# Patient Record
Sex: Female | Born: 1950 | Race: White | Hispanic: No | Marital: Single | Smoking: Never smoker
Health system: Southern US, Community
[De-identification: ages and names within clinical notes are randomized; demographics above are authoritative.]

## PROBLEM LIST (undated history)

## (undated) DIAGNOSIS — T7840XA Allergy, unspecified, initial encounter: Secondary | ICD-10-CM

## (undated) DIAGNOSIS — F32A Depression, unspecified: Secondary | ICD-10-CM

## (undated) DIAGNOSIS — M810 Age-related osteoporosis without current pathological fracture: Secondary | ICD-10-CM

## (undated) DIAGNOSIS — M1990 Unspecified osteoarthritis, unspecified site: Secondary | ICD-10-CM

## (undated) DIAGNOSIS — C801 Malignant (primary) neoplasm, unspecified: Secondary | ICD-10-CM

## (undated) DIAGNOSIS — F329 Major depressive disorder, single episode, unspecified: Secondary | ICD-10-CM

## (undated) HISTORY — DX: Depression, unspecified: F32.A

## (undated) HISTORY — DX: Allergy, unspecified, initial encounter: T78.40XA

## (undated) HISTORY — PX: BREAST EXCISIONAL BIOPSY: SUR124

## (undated) HISTORY — PX: TUBAL LIGATION: SHX77

## (undated) HISTORY — DX: Malignant (primary) neoplasm, unspecified: C80.1

## (undated) HISTORY — DX: Major depressive disorder, single episode, unspecified: F32.9

## (undated) HISTORY — DX: Age-related osteoporosis without current pathological fracture: M81.0

## (undated) HISTORY — DX: Unspecified osteoarthritis, unspecified site: M19.90

---

## 1998-11-30 ENCOUNTER — Other Ambulatory Visit: Admission: RE | Admit: 1998-11-30 | Discharge: 1998-11-30 | Payer: Self-pay | Admitting: *Deleted

## 1999-09-07 ENCOUNTER — Other Ambulatory Visit: Admission: RE | Admit: 1999-09-07 | Discharge: 1999-09-07 | Payer: Self-pay | Admitting: *Deleted

## 1999-09-07 ENCOUNTER — Encounter (INDEPENDENT_AMBULATORY_CARE_PROVIDER_SITE_OTHER): Payer: Self-pay | Admitting: Specialist

## 1999-12-31 ENCOUNTER — Other Ambulatory Visit: Admission: RE | Admit: 1999-12-31 | Discharge: 1999-12-31 | Payer: Self-pay | Admitting: *Deleted

## 2000-01-15 ENCOUNTER — Emergency Department (HOSPITAL_COMMUNITY): Admission: EM | Admit: 2000-01-15 | Discharge: 2000-01-15 | Payer: Self-pay

## 2000-03-17 ENCOUNTER — Encounter: Payer: Self-pay | Admitting: Neurosurgery

## 2000-03-17 ENCOUNTER — Ambulatory Visit (HOSPITAL_COMMUNITY): Admission: RE | Admit: 2000-03-17 | Discharge: 2000-03-17 | Payer: Self-pay | Admitting: Neurosurgery

## 2000-12-30 ENCOUNTER — Other Ambulatory Visit: Admission: RE | Admit: 2000-12-30 | Discharge: 2000-12-30 | Payer: Self-pay | Admitting: *Deleted

## 2001-03-19 ENCOUNTER — Other Ambulatory Visit: Admission: RE | Admit: 2001-03-19 | Discharge: 2001-03-19 | Payer: Self-pay | Admitting: *Deleted

## 2002-01-13 ENCOUNTER — Other Ambulatory Visit: Admission: RE | Admit: 2002-01-13 | Discharge: 2002-01-13 | Payer: Self-pay | Admitting: *Deleted

## 2003-01-17 ENCOUNTER — Other Ambulatory Visit: Admission: RE | Admit: 2003-01-17 | Discharge: 2003-01-17 | Payer: Self-pay | Admitting: *Deleted

## 2003-10-04 ENCOUNTER — Other Ambulatory Visit: Admission: RE | Admit: 2003-10-04 | Discharge: 2003-10-04 | Payer: Self-pay | Admitting: *Deleted

## 2004-01-16 ENCOUNTER — Other Ambulatory Visit: Admission: RE | Admit: 2004-01-16 | Discharge: 2004-01-16 | Payer: Self-pay | Admitting: *Deleted

## 2005-02-28 ENCOUNTER — Other Ambulatory Visit: Admission: RE | Admit: 2005-02-28 | Discharge: 2005-02-28 | Payer: Self-pay | Admitting: *Deleted

## 2006-10-27 ENCOUNTER — Other Ambulatory Visit: Admission: RE | Admit: 2006-10-27 | Discharge: 2006-10-27 | Payer: Self-pay | Admitting: *Deleted

## 2008-09-03 ENCOUNTER — Emergency Department (HOSPITAL_BASED_OUTPATIENT_CLINIC_OR_DEPARTMENT_OTHER): Admission: EM | Admit: 2008-09-03 | Discharge: 2008-09-03 | Payer: Self-pay | Admitting: Emergency Medicine

## 2009-07-06 ENCOUNTER — Encounter: Admission: RE | Admit: 2009-07-06 | Discharge: 2009-07-06 | Payer: Self-pay | Admitting: Family Medicine

## 2009-08-28 ENCOUNTER — Other Ambulatory Visit: Admission: RE | Admit: 2009-08-28 | Discharge: 2009-08-28 | Payer: Self-pay | Admitting: Family Medicine

## 2010-01-30 ENCOUNTER — Emergency Department (HOSPITAL_BASED_OUTPATIENT_CLINIC_OR_DEPARTMENT_OTHER): Admission: EM | Admit: 2010-01-30 | Discharge: 2010-01-30 | Payer: Self-pay | Admitting: Emergency Medicine

## 2010-01-30 ENCOUNTER — Ambulatory Visit: Payer: Self-pay | Admitting: Diagnostic Radiology

## 2010-02-21 ENCOUNTER — Telehealth (INDEPENDENT_AMBULATORY_CARE_PROVIDER_SITE_OTHER): Payer: Self-pay | Admitting: *Deleted

## 2010-02-22 ENCOUNTER — Ambulatory Visit: Payer: Self-pay | Admitting: Cardiology

## 2010-02-22 ENCOUNTER — Ambulatory Visit: Payer: Self-pay

## 2010-02-22 ENCOUNTER — Encounter (HOSPITAL_COMMUNITY): Admission: RE | Admit: 2010-02-22 | Discharge: 2010-04-25 | Payer: Self-pay | Admitting: Internal Medicine

## 2010-03-02 ENCOUNTER — Telehealth: Payer: Self-pay | Admitting: Internal Medicine

## 2010-03-19 DIAGNOSIS — E785 Hyperlipidemia, unspecified: Secondary | ICD-10-CM | POA: Insufficient documentation

## 2010-12-25 NOTE — Progress Notes (Signed)
Summary: Nuclear pre procedure  Phone Note Outgoing Call Call back at [facility] Phone [phone]   Call placed by: Rea College, CMA,  February 21, 2010 4:38 PM Call placed to: Patient Summary of Call: Reviewed information on Myoview Information Sheet (see scanned document for further details).  Spoke with patient.      Nuclear Med Background Indications for Stress Test: Evaluation for Ischemia, Post Hospital  Indications Comments: 01/30/10 [facility] with chest pain, abnormal EKG; (-) enzymes.    Symptoms: Chest Tightness, Diaphoresis, DOE, Nausea, Near Syncope  Symptoms Comments: Near syncope with CP.   Nuclear Pre-Procedure Cardiac Risk Factors: Lipids

## 2010-12-25 NOTE — Progress Notes (Signed)
Summary: test results  Phone Note Call from Patient Call back at Home Phone [phone]   Caller: Patient Reason for Call: Talk to Nurse, Lab or Test Results Details for Reason: stress test.  Initial call taken by: Lorne Skeens,  March 02, 2010 12:46 PM  Follow-up for Phone Call        pt aware of results Meredith Staggers, RN  March 02, 2010 1:29 PM

## 2010-12-25 NOTE — Assessment & Plan Note (Signed)
Summary: Cardiology Nuclear Study  Nuclear Med Background Indications for Stress Test: Evaluation for Ischemia, Post Hospital  Indications Comments: 01/30/10 [facility] with chest pain, abnormal EKG; (-) enzymes.    Symptoms: Chest Tightness, Diaphoresis, Dizziness, DOE, Fatigue, Light-Headedness, Nausea, Near Syncope, Rapid HR  Symptoms Comments: Near syncope with CP.   Nuclear Pre-Procedure Cardiac Risk Factors: Lipids Caffeine/Decaff Intake: none NPO After: 7:30 PM Lungs: Clear IV 0.9% NS with Angio Cath: 20g     IV Site: (R) AC IV Started by: Stanton Kidney EMT-P Chest Size (in) 38     Cup Size C     Height (in): 63 Weight (lb): 134 BMI: 23.82  Nuclear Med Study 1 or 2 day study:  1 day     Stress Test Type:  Stress Reading MD:  Olga Millers, MD     Referring MD:  D. Bensimhon Resting Radionuclide:  Technetium 62m Tetrofosmin     Resting Radionuclide Dose:  11.0 mCi  Stress Radionuclide:  Technetium 43m Tetrofosmin     Stress Radionuclide Dose:  33.0 mCi   Stress Protocol Exercise Time (min):  8:01 min     Max HR:  151 bpm     Predicted Max HR:  163 bpm  Max Systolic BP: 161 mm Hg     Percent Max HR:  92.64 %     METS: 10.1 Rate Pressure Product:  04540    Stress Test Technologist:  Irean Hong RN     Nuclear Technologist:  Domenic Polite CNMT  Rest Procedure  Myocardial perfusion imaging was performed at rest 45 minutes following the intravenous administration of Myoview Technetium 35m Tetrofosmin.  Stress Procedure  The patient exercised for 8 minutes and 01 second.  The patient stopped due to DOE, O2 sat 95% at peak,   and denied any chest pain.  There were nonspecific ST-T wave changes, rare PAC.  Myoview was injected at peak exercise and myocardial perfusion imaging was performed after a brief delay.  QPS Raw Data Images:  Acuisition technically good; normal left ventricular size. Stress Images:  There is normal uptake in all areas. Rest Images:  Normal homogeneous  uptake in all areas of the myocardium. Subtraction (SDS):  No evidence of ischemia. Transient Ischemic Dilatation:  1.02  (Normal <1.22)  Lung/Heart Ratio:  .36  (Normal <0.45)  Quantitative Gated Spect Images QGS EDV:  143 ml QGS ESV:  74 ml QGS EF:  79 % QGS cine images:  Normal wall motion.   Overall Impression  Exercise Capacity: Fair exercise capacity. BP Response: Normal blood pressure response. Clinical Symptoms: There is dyspnea. ECG Impression: No significant ST segment change suggestive of ischemia. Overall Impression: There is no sign of scar or ischemia.  Appended Document: Cardiology Nuclear Study pt aware of results

## 2011-02-17 LAB — POCT CARDIAC MARKERS: CKMB, poc: 1.1 ng/mL (ref 1.0–8.0)

## 2011-02-17 LAB — BASIC METABOLIC PANEL
BUN: 14 mg/dL (ref 6–23)
CO2: 30 mEq/L (ref 19–32)
Calcium: 9.7 mg/dL (ref 8.4–10.5)
Chloride: 106 mEq/L (ref 96–112)
Creatinine, Ser: 0.8 mg/dL (ref 0.4–1.2)
Glucose, Bld: 93 mg/dL (ref 70–99)
Potassium: 4.2 mEq/L (ref 3.5–5.1)
Sodium: 144 mEq/L (ref 135–145)

## 2011-02-17 LAB — DIFFERENTIAL
Basophils Absolute: 0.1 10*3/uL (ref 0.0–0.1)
Eosinophils Relative: 2 % (ref 0–5)
Lymphs Abs: 2.7 10*3/uL (ref 0.7–4.0)
Neutro Abs: 4.6 10*3/uL (ref 1.7–7.7)
Neutrophils Relative %: 56 % (ref 43–77)

## 2011-02-17 LAB — CBC
Hemoglobin: 13.6 g/dL (ref 12.0–15.0)
MCV: 85.9 fL (ref 78.0–100.0)
WBC: 8.2 10*3/uL (ref 4.0–10.5)

## 2011-08-27 LAB — CBC
HCT: 40.7
Hemoglobin: 13.7
MCV: 86.5
Platelets: 250
RBC: 4.71
WBC: 12.4 — ABNORMAL HIGH

## 2011-08-27 LAB — URINALYSIS, ROUTINE W REFLEX MICROSCOPIC
Bilirubin Urine: NEGATIVE
Specific Gravity, Urine: 1.014

## 2011-08-27 LAB — PROTIME-INR
INR: 0.9
Prothrombin Time: 12.2

## 2011-08-27 LAB — DIFFERENTIAL
Basophils Relative: 1
Lymphs Abs: 1.9
Monocytes Relative: 5
Neutro Abs: 9.8 — ABNORMAL HIGH
Neutrophils Relative %: 79 — ABNORMAL HIGH

## 2011-08-27 LAB — COMPREHENSIVE METABOLIC PANEL
ALT: 16
AST: 33
Albumin: 4.3
Creatinine, Ser: 0.8
GFR calc Af Amer: >60
GFR calc non Af Amer: >60
Sodium: 143
Total Protein: 7.3

## 2011-08-27 LAB — APTT: aPTT: 27

## 2011-08-27 LAB — URINE CULTURE

## 2011-12-08 ENCOUNTER — Ambulatory Visit (INDEPENDENT_AMBULATORY_CARE_PROVIDER_SITE_OTHER): Payer: BC Managed Care – PPO

## 2011-12-08 DIAGNOSIS — B9789 Other viral agents as the cause of diseases classified elsewhere: Secondary | ICD-10-CM

## 2011-12-08 DIAGNOSIS — M62838 Other muscle spasm: Secondary | ICD-10-CM

## 2011-12-08 DIAGNOSIS — M545 Low back pain: Secondary | ICD-10-CM

## 2015-03-21 ENCOUNTER — Ambulatory Visit (INDEPENDENT_AMBULATORY_CARE_PROVIDER_SITE_OTHER): Payer: BC Managed Care – PPO | Admitting: Family Medicine

## 2015-03-21 VITALS — BP 116/74 | HR 83 | Temp 98.0°F | Resp 16 | Ht 61.5 in | Wt 133.1 lb

## 2015-03-21 DIAGNOSIS — Z23 Encounter for immunization: Secondary | ICD-10-CM

## 2015-03-21 DIAGNOSIS — W5501XA Bitten by cat, initial encounter: Secondary | ICD-10-CM

## 2015-03-21 DIAGNOSIS — S61452A Open bite of left hand, initial encounter: Secondary | ICD-10-CM

## 2015-03-21 MED ORDER — AMOXICILLIN-POT CLAVULANATE 875-125 MG PO TABS: 1.0000 | ORAL_TABLET | Freq: Two times a day (BID) | ORAL | Status: DC

## 2015-03-21 MED ORDER — MUPIROCIN 2 % EX OINT: 1.0000 "application " | TOPICAL_OINTMENT | Freq: Every day | CUTANEOUS | Status: DC

## 2015-03-21 NOTE — Patient Instructions (Addendum)
FAST Track pass for wound re-check this Friday, 03/24/15 with any provider.  Animal Bite An animal bite can result in a scratch on the skin, deep open cut, puncture of the skin, crush injury, or tearing away of the skin or a body part. Dogs are responsible for most animal bites. Children are bitten more often than adults. An animal bite can range from very mild to more serious. A small bite from your house pet is no cause for alarm. However, some animal bites can become infected or injure a bone or other tissue. You must seek medical care if:  The skin is broken and bleeding does not slow down or stop after 15 minutes.  The puncture is deep and difficult to clean (such as a cat bite).  Pain, warmth, redness, or pus develops around the wound.  The bite is from a stray animal or rodent. There may be a risk of rabies infection.  The bite is from a snake, raccoon, skunk, fox, coyote, or bat. There may be a risk of rabies infection.  The person bitten has a chronic illness such as diabetes, liver disease, or cancer, or the person takes medicine that lowers the immune system.  There is concern about the location and severity of the bite. It is important to clean and protect an animal bite wound right away to prevent infection. Follow these steps:  Clean the wound with plenty of water and soap.  Apply an antibiotic cream.  Apply gentle pressure over the wound with a clean towel or gauze to slow or stop bleeding.  Elevate the affected area above the heart to help stop any bleeding.  Seek medical care. Getting medical care within 8 hours of the animal bite leads to the best possible outcome. DIAGNOSIS  Your caregiver will most likely:  Take a detailed history of the animal and the bite injury.  Perform a wound exam.  Take your medical history. Blood tests or X-rays may be performed. Sometimes, infected bite wounds are cultured and sent to a lab to identify the infectious bacteria.   TREATMENT  Medical treatment will depend on the location and type of animal bite as well as the patient's medical history. Treatment may include:  Wound care, such as cleaning and flushing the wound with saline solution, bandaging, and elevating the affected area.  Antibiotics.  Tetanus immunization.  Rabies immunization.  Leaving the wound open to heal. This is often done with animal bites, due to the high risk of infection. However, in certain cases, wound closure with stitches, wound adhesive, skin adhesive strips, or staples may be used. Infected bites that are left untreated may require intravenous (IV) antibiotics and surgical treatment in the hospital. [place]  Follow your caregiver's instructions for wound care.  Take all medicines as directed.  If your caregiver prescribes antibiotics, take them as directed. Finish them even if you start to feel better.  Follow up with your caregiver for further exams or immunizations as directed. You may need a tetanus shot if:  You cannot remember when you had your last tetanus shot.  You have never had a tetanus shot.  The injury broke your skin. If you get a tetanus shot, your arm may swell, get red, and feel warm to the touch. This is common and not a problem. If you need a tetanus shot and you choose not to have one, there is a rare chance of getting tetanus. Sickness from tetanus can be serious. [place]  IF:  You notice warmth, redness, soreness, swelling, pus discharge, or a bad smell coming from the wound.  You have a red line on the skin coming from the wound.  You have a fever, chills, or a general ill feeling.  You have nausea or vomiting.  You have continued or worsening pain.  You have trouble moving the injured part.  You have other questions or concerns. MAKE SURE YOU:  Understand these instructions.  Will watch your condition.  Will get help right away if you are not doing well or  get worse. Document Released: 07/30/2011 Document Revised: 02/03/2012 Document Reviewed: 07/30/2011 [facility] Patient Information 2015 [place], [place]. This information is not intended to replace advice given to you by your health care provider. Make sure you discuss any questions you have with your health care provider.

## 2015-03-21 NOTE — Progress Notes (Signed)
   Subjective:    Patient ID: Melissa Bush, female    DOB: Aug 26, 1951, 64 y.o.   MRN: [id number]  HPI Melissa Bush is a 64 year-old female who presents following a cat bite to her left hand. The bite occurred around 5:30 PM this evening. The cat was not acting abnormally prior to the event. 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 No recent change in behavior. The cat was in the bath tub for a bath, aggittated with not wanting a bath, when the cat bit the patient's left hand. The cat released the patient's hand, but not until it left two deep puncture wounds to the dorsum and side of the radial aspect of the patient's proximal thumb. No thumb weakness, just pain increased with moving her thumb. Mild swelling. No numbness or tingling. Took ibuprofen initially which has helped.  Last tetanus vaccine: Overdue. Last was over 10 years ago.  Allergies  Allergen Reactions  . Sulfonamide Derivatives    Past medical history, social history, medications, and allergies were reviewed and are up to date in the chart.  Review of Systems No Purulence No Lymphangitis No Fevers No Chills No Nausea No Vomiting No Fatigue    Objective:   Physical Exam BP 116/74 mmHg  Pulse 83  Temp(Src) 98 F (36.7 C) (Oral)  Resp 16  Ht 5' 1.5" (1.562 m)  Wt 133 lb 2 oz (60.385 kg)  BMI 24.75 kg/m2  SpO2 95% General appearance: alert, cooperative, appears stated age and no distress Pulses: 2+ and symmetric Skin: 4-5 areas of punctate erythema located in a cat-mouth pattern located across the dorsum of the radial left hand. Two appear to be deep punctate lesions on the dorsum and lateral hand. Mild bleeding if expressed. No purulence. No lymphangitis. Minimal surrounding erythema. No fluctuance. Lymph nodes: No lymphadenopathy. MSK: Full ROM of the wrist, digits, MCP, PIP, and DIP joints with minimal pain. Resisted range of motion in all planes with intact strenth. Neuro: Sensation  intact throughout. Good motor strength throughout. No fasciculations or flaccidity.     Assessment & Plan:  1. Cat bite to left hand, with resulting 2 areas of deeper puncture wounds. Very low risk without suspicion for rabies, cat domestic with clear instigating event leading to the bite.  -Wound copiously irrigated and cleansed, though clearly cannot get deeper into puncture wounds on hand. -Mupirocin ointment and band-aids applied to 2 deeper wounds -Tdap vaccine given today -Rx Augmentin 875mg  bid x 10 days -Rx mupirocin topical once daily during dressing changes. Wash, clean irrigate once dialy and apply abx and band-aid. -Plan follow-up in 3 days on 03/24/15 for wound check. Fast track pass given. -Discussed reasons to RTC or go to ED including spreading rash, numbness, tingling, fever, chills, swelling, or increasing pain. She verbalized understanding and agreement.  Dr. Linnell Fulling, DO Sports Medicine Fellow  Discussed with Dr. Linna Darner.

## 2015-09-30 ENCOUNTER — Other Ambulatory Visit: Payer: Self-pay | Admitting: Family Medicine

## 2015-09-30 DIAGNOSIS — R5381 Other malaise: Secondary | ICD-10-CM

## 2015-10-06 ENCOUNTER — Other Ambulatory Visit: Payer: Self-pay | Admitting: Family Medicine

## 2015-10-06 ENCOUNTER — Other Ambulatory Visit: Payer: Self-pay

## 2015-10-06 DIAGNOSIS — Z1231 Encounter for screening mammogram for malignant neoplasm of breast: Secondary | ICD-10-CM

## 2015-10-06 DIAGNOSIS — E2839 Other primary ovarian failure: Secondary | ICD-10-CM

## 2015-11-10 ENCOUNTER — Ambulatory Visit
Admission: RE | Admit: 2015-11-10 | Discharge: 2015-11-10 | Disposition: A | Discharge status: Home / Self Care | Payer: BC Managed Care – PPO | Source: Ambulatory Visit | Attending: Family Medicine | Admitting: Family Medicine

## 2015-11-10 ENCOUNTER — Ambulatory Visit
Admission: RE | Admit: 2015-11-10 | Discharge: 2015-11-10 | Disposition: A | Discharge status: Home / Self Care | Payer: BC Managed Care – PPO | Source: Ambulatory Visit

## 2015-11-10 DIAGNOSIS — Z1231 Encounter for screening mammogram for malignant neoplasm of breast: Secondary | ICD-10-CM

## 2015-11-10 DIAGNOSIS — E2839 Other primary ovarian failure: Secondary | ICD-10-CM

## 2016-12-27 ENCOUNTER — Ambulatory Visit (INDEPENDENT_AMBULATORY_CARE_PROVIDER_SITE_OTHER): Payer: BC Managed Care – PPO | Admitting: Family Medicine

## 2016-12-27 VITALS — BP 124/68 | HR 84 | Resp 16 | Ht 63.0 in | Wt 141.2 lb

## 2016-12-27 DIAGNOSIS — Z124 Encounter for screening for malignant neoplasm of cervix: Secondary | ICD-10-CM

## 2016-12-27 DIAGNOSIS — Z1211 Encounter for screening for malignant neoplasm of colon: Secondary | ICD-10-CM

## 2016-12-27 DIAGNOSIS — Z13220 Encounter for screening for lipoid disorders: Secondary | ICD-10-CM

## 2016-12-27 DIAGNOSIS — Z1329 Encounter for screening for other suspected endocrine disorder: Secondary | ICD-10-CM

## 2016-12-27 DIAGNOSIS — M1990 Unspecified osteoarthritis, unspecified site: Secondary | ICD-10-CM | POA: Diagnosis not present

## 2016-12-27 DIAGNOSIS — Z131 Encounter for screening for diabetes mellitus: Secondary | ICD-10-CM | POA: Diagnosis not present

## 2016-12-27 DIAGNOSIS — Z0000 Encounter for general adult medical examination without abnormal findings: Secondary | ICD-10-CM

## 2016-12-27 MED ORDER — ZOSTER VACCINE LIVE 19400 UNT/0.65ML ~~LOC~~ SUSR: 0.6500 mL | Freq: Once | SUBCUTANEOUS | 0 refills | Status: DC

## 2016-12-27 MED ORDER — ZOSTER VACCINE LIVE 19400 UNT/0.65ML ~~LOC~~ SUSR: 0.6500 mL | Freq: Once | SUBCUTANEOUS | 0 refills | Status: AC

## 2016-12-27 NOTE — Progress Notes (Signed)

## 2016-12-27 NOTE — Progress Notes (Signed)
Melissa Bush  MRN: [id number] DOB: 1951-10-13  Subjective:  Melissa Bush is a 66 y.o. female who presents for annual physical exam and completion of a pre-employment form. Patient will be employed through [employer] system and working as a Economist. Reports that it has been quite sometime since she has had a complete physical exam. Last dental exam: Due now  Last vision exam:  Due now. Last exam in July 2017 in which she was dx with  borderline macular degeneration  Last pap smear: never an abnormal results. Would like final PAP today.  Last mammogram: 2016 Last colonoscopy: overdue and requests a referral  Vaccinations:      Already received influenza at a local pharmacy  Patient Active Problem List   Diagnosis Date Noted  . HYPERLIPIDEMIA-MIXED 03/19/2010    Current Outpatient Prescriptions on File Prior to Visit  Medication Sig Dispense Refill  . aspirin 81 MG tablet Take 81 mg by mouth daily.    Marland Kitchen atorvastatin (LIPITOR) 40 MG tablet Take 40 mg by mouth daily at 6 PM.     . VENLAFAXINE HCL ER PO Take 50 mg by mouth daily with breakfast.     . amoxicillin-clavulanate (AUGMENTIN) 875-125 MG per tablet Take 1 tablet by mouth 2 (two) times daily. (Patient not taking: Reported on 12/27/2016) 20 tablet 0  . mupirocin ointment (BACTROBAN) 2 % Apply 1 application topically daily. (Patient not taking: Reported on 12/27/2016) 15 g 0   No current facility-administered medications on file prior to visit.     Allergies  Allergen Reactions  . Sulfonamide Derivatives     Social History   Social History  . Marital status: Single    Spouse name: N/A  . Number of children: N/A  . Years of education: N/A   Social History Main Topics  . Smoking status: Never Smoker  . Smokeless tobacco: Never Used  . Alcohol use No  . Drug use: No  . Sexual activity: Not on file   Other Topics Concern  . Not on file   Social History Narrative  . No narrative on  file    Past Surgical History:  Procedure Laterality Date  . CESAREAN SECTION    . TUBAL LIGATION      No family history on file.  Review of Systems  Constitutional: Negative.   HENT: Positive for sinus pain and sinus pressure.   Eyes: Positive for visual disturbance.  Respiratory: Negative.   Cardiovascular: Negative.   Gastrointestinal: Positive for constipation.       Increased intake of coffee  Still constipation and Hemorrhoids    Acid reflux and she tries not to medication   Musculoskeletal: Positive for arthralgias and joint swelling.  Skin: Negative.   Neurological: Positive for headaches.       Sinus related headaches  Hx of vertigo  Psychiatric/Behavioral: Negative for agitation. The patient is nervous/anxious.        Depression related to death of husband 3 years ago Situational anxious with daughter. Managed appropriately with Effexor     Objective:  BP 124/68 (Cuff Size: Normal)   Pulse 84   Resp 16   Ht '5\' 3"'  (1.6 m)   Wt 141 lb 3.2 oz (64 kg)   SpO2 96%   BMI 25.01 kg/m   Physical Exam  Constitutional: She is oriented to person, place, and time and well-developed, well-nourished, and in no distress.  HENT:  Head: Normocephalic and atraumatic.  Right Ear: External  ear normal.  Left Ear: External ear normal.  Nose: Nose normal.  Mouth/Throat: Oropharynx is clear and moist.  Eyes: Conjunctivae and EOM are normal. Pupils are equal, round, and reactive to light.  Neck: Normal range of motion. Neck supple.  Cardiovascular: Normal rate, regular rhythm, normal heart sounds and intact distal pulses.   Pulmonary/Chest: Effort normal and breath sounds normal.  Abdominal: Soft. Bowel sounds are normal. She exhibits no distension and no mass. There is no tenderness. There is no rebound and no guarding.  Genitourinary: Vagina normal, uterus normal, cervix normal, right adnexa normal and left adnexa normal.  Musculoskeletal: Normal range of motion.    Neurological: She is alert and oriented to person, place, and time. Gait normal. GCS score is 15.  Skin: Skin is warm and dry.  Psychiatric: Mood, memory, affect and judgment normal.    Visual Acuity Screening   Right eye Left eye Both eyes  Without correction:     With correction: '20/20 20/15 20/15 '    Assessment and Plan :  Discussed healthy lifestyle, diet, exercise, preventative care, vaccinations, and addressed patient's concerns. Plan for follow up in 12 months. Otherwise, plan for specific conditions below.  1. Annual physical exam Age appropriate anticipatory guidance provided  - CBC with Differential/Platelet  2. Screening for diabetes mellitus  3. Thyroid disorder screen - Thyroid Panel With TSH - CMP14+EGFR  4. Screening, lipid - Lipid panel  5. Special screening for malignant neoplasms, colon - Ambulatory referral to Gastroenterology  6. Arthritis - Vitamin D, 25-hydroxy  7. Screening for cervical cancer - Pap IG and HPV (high risk) DNA detection  You will be notified of you lab results.  Carroll Sage. Kenton Kingfisher, MSN, FNP-C [facility] [place]

## 2016-12-27 NOTE — Patient Instructions (Addendum)
You will notified of your lab results.  I have placed a referral for your colonoscopy to Gastroenterology     IF you received an x-ray today, you will receive an invoice from [facility]. Please contact [facility] at [phone] with questions or concerns regarding your invoice.   IF you received labwork today, you will receive an invoice from [place]. Please contact [facility] at [phone] with questions or concerns regarding your invoice.   Our billing staff will not be able to assist you with questions regarding bills from these companies.  You will be contacted with the lab results as soon as they are available. The fastest way to get your results is to activate your My Chart account. Instructions are located on the last page of this paperwork. If you have not heard from [place] regarding the results in 2 weeks, please contact this office.     About Hemorrhoids  Hemorrhoids are swollen veins in the lower rectum and anus.  Also called piles, hemorrhoids are a common problem.  Hemorrhoids may be internal (inside the rectum) or external (around the anus).  Internal Hemorrhoids  Internal hemorrhoids are often painless, but they rarely cause bleeding.  The internal veins may stretch and fall down (prolapse) through the anus to the outside of the body.  The veins may then become irritated and painful.  External Hemorrhoids  External hemorrhoids can be easily seen or felt around the anal opening.  They are under the skin around the anus.  When the swollen veins are scratched or broken by straining, rubbing or wiping they sometimes bleed.  How Hemorrhoids Occur  Veins in the rectum and around the anus tend to swell under pressure.  Hemorrhoids can result from increased pressure in the veins of your anus or rectum.  Some sources of pressure are:   Straining to have a bowel movement because of constipation  Waiting too long to have a bowel movement  Coughing and  sneezing often  Sitting for extended periods of time, including on the toilet  Diarrhea  Obesity  Trauma or injury to the anus  Some liver diseases  Stress  Family history of hemorrhoids  Pregnancy  Pregnant women should try to avoid becoming constipated, because they are more likely to have hemorrhoids during pregnancy.  In the last trimester of pregnancy, the enlarged uterus may press on blood vessels and causes hemorrhoids.  In addition, the strain of childbirth sometimes causes hemorrhoids after the birth.  Symptoms of Hemorrhoids  Some symptoms of hemorrhoids include:  Swelling and/or a tender lump around the anus  Itching, mild burning and bleeding around the anus  Painful bowel movements with or without constipation  Bright red blood covering the stool, on toilet paper or in the toilet bowel.   Symptoms usually go away within a few days.  Always talk to your doctor about any bleeding to make sure it is not from some other causes.  Diagnosing and Treating Hemorrhoids  Diagnosis is made by an examination by your healthcare provider.  Special test can be performed by your doctor.    Most cases of hemorrhoids can be treated with:  High-fiber diet: Eat more high-fiber foods, which help prevent constipation.  Ask for more detailed fiber information on types and sources of fiber from your healthcare provider.  Fluids: Drink plenty of water.  This helps soften bowel movements so they are easier to pass.  Sitz baths and cold packs: Sitting in lukewarm water two or three times a  day for 15 minutes cleases the anal area and may relieve discomfort.  If the water is too hot, swelling around the anus will get worse.  Placing a cloth-covered ice pack on the anus for ten minutes four times a day can also help reduce selling.  Gently pushing a prolapsed hemorrhoid back inside after the bath or ice pack can be helpful.  Medications: For mild discomfort, your healthcare provider may  suggest over-the-counter pain medication or prescribe a cream or ointment for topical use.  The cream may contain witch hazel, zinc oxide or petroleum jelly.  Medicated suppositories are also a treatment option.  Always consult your doctor before applying medications or creams.  Procedures and surgeries: There are also a number of procedures and surgeries to shrink or remove hemorrhoids in more serious cases.  Talk to your physician about these options.  You can often prevent hemorrhoids or keep them from becoming worse by maintaining a healthy lifestyle.  Eat a fiber-rich diet of fruits, vegetables and whole grains.  Also, drink plenty of water and exercise regularly.   2007, [employer] Doc.30About Constipation  Constipation Overview Constipation is the most common gastrointestinal complaint - about 4 million Americans experience constipation and make 2.5 million physician visits a year to get help for the problem.  Constipation can occur when the colon absorbs too much water, the colon's muscle contraction is slow or sluggish, and/or there is delayed transit time through the colon.  The result is stool that is hard and dry.  Indicators of constipation include straining during bowel movements greater than 25% of the time, having fewer than three bowel movements per week, and/or the feeling of incomplete evacuation.  There are established guidelines ([place] II ) for defining constipation. A person needs to have two or more of the following symptoms for at least 12 weeks (not necessarily consecutive) in the preceding 12 months: . Straining in  greater than 25% of bowel movements . Lumpy or hard stools in greater than 25% of bowel movements . Sensation of incomplete emptying in greater than 25% of bowel movements . Sensation of anorectal obstruction/blockade in greater than 25% of bowel movements . Manual maneuvers to help empty greater than 25% of bowel movements (e.g., digital  evacuation, support of the pelvic floor)  . Less than  3 bowel movements/week . Loose stools are not present, and criteria for irritable bowel syndrome are insufficient  Common Causes of Constipation . Lack of fiber in your diet . Lack of physical activity . Medications, including iron and calcium supplements  . Dairy intake . Dehydration . Abuse of laxatives  Travel  Irritable Bowel Syndrome  Pregnancy  Luteal phase of menstruation (after ovulation and before menses)  Colorectal problems  Intestinal Dysfunction  Treating Constipation  There are several ways of treating constipation, including changes to diet and exercise, use of laxatives, adjustments to the pelvic floor, and scheduled toileting.  These treatments include: . increasing fiber and fluids in the diet  . increasing physical activity . learning muscle coordination   learning proper toileting techniques and toileting modifications   designing and sticking  to a toileting schedule     2007, [employer] Doc.22

## 2016-12-28 LAB — THYROID PANEL WITH TSH
Free Thyroxine Index: 1.5 (ref 1.2–4.9)
T3 Uptake Ratio: 23 % — ABNORMAL LOW (ref 24–39)
T4, Total: 6.7 ug/dL (ref 4.5–12.0)
TSH: 1.73 u[IU]/mL (ref 0.450–4.500)

## 2016-12-28 LAB — CMP14+EGFR
ALBUMIN: 4.6 g/dL (ref 3.6–4.8)
ALT: 31 IU/L (ref 0–32)
AST: 20 IU/L (ref 0–40)
Albumin/Globulin Ratio: 1.8 (ref 1.2–2.2)
Alkaline Phosphatase: 100 IU/L (ref 39–117)
BILIRUBIN TOTAL: 0.4 mg/dL (ref 0.0–1.2)
BUN / CREAT RATIO: 15 (ref 12–28)
BUN: 13 mg/dL (ref 8–27)
CO2: 24 mmol/L (ref 18–29)
CREATININE: 0.84 mg/dL (ref 0.57–1.00)
Calcium: 9.9 mg/dL (ref 8.7–10.3)
Chloride: 99 mmol/L (ref 96–106)
GFR calc non Af Amer: 74 mL/min/{1.73_m2} (ref >59)
GFR, EST AFRICAN AMERICAN: 85 mL/min/{1.73_m2} (ref >59)
GLOBULIN, TOTAL: 2.5 g/dL (ref 1.5–4.5)
Glucose: 90 mg/dL (ref 65–99)
Potassium: 3.9 mmol/L (ref 3.5–5.2)
SODIUM: 141 mmol/L (ref 134–144)
TOTAL PROTEIN: 7.1 g/dL (ref 6.0–8.5)

## 2016-12-28 LAB — CBC WITH DIFFERENTIAL/PLATELET
Basophils Absolute: 0 10*3/uL (ref 0.0–0.2)
Basos: 0 %
EOS (ABSOLUTE): 0.1 10*3/uL (ref 0.0–0.4)
EOS: 1 %
HEMATOCRIT: 39.8 % (ref 34.0–46.6)
HEMOGLOBIN: 13.5 g/dL (ref 11.1–15.9)
Immature Grans (Abs): 0 10*3/uL (ref 0.0–0.1)
Immature Granulocytes: 0 %
Lymphocytes Absolute: 2.6 10*3/uL (ref 0.7–3.1)
Lymphs: 33 %
MCH: 29.1 pg (ref 26.6–33.0)
MCHC: 33.9 g/dL (ref 31.5–35.7)
MCV: 86 fL (ref 79–97)
MONOCYTES: 7 %
Monocytes Absolute: 0.5 10*3/uL (ref 0.1–0.9)
NEUTROS ABS: 4.7 10*3/uL (ref 1.4–7.0)
Neutrophils: 59 %
Platelets: 322 10*3/uL (ref 150–379)
RBC: 4.64 x10E6/uL (ref 3.77–5.28)
RDW: 13.9 % (ref 12.3–15.4)
WBC: 8 10*3/uL (ref 3.4–10.8)

## 2016-12-28 LAB — LIPID PANEL
CHOL/HDL RATIO: 3.6 ratio (ref 0.0–4.4)
Cholesterol, Total: 181 mg/dL (ref 100–199)
HDL: 50 mg/dL (ref >39)
LDL CALC: 111 mg/dL — AB (ref 0–99)
Triglycerides: 99 mg/dL (ref 0–149)
VLDL CHOLESTEROL CAL: 20 mg/dL (ref 5–40)

## 2016-12-28 LAB — VITAMIN D 25 HYDROXY (VIT D DEFICIENCY, FRACTURES): Vit D, 25-Hydroxy: 41.2 ng/mL (ref 30.0–100.0)

## 2016-12-31 ENCOUNTER — Encounter: Payer: Self-pay | Admitting: Family Medicine

## 2016-12-31 LAB — PAP IG AND HPV HIGH-RISK
HPV, HIGH-RISK: NEGATIVE
PAP SMEAR COMMENT: 0

## 2017-01-27 ENCOUNTER — Encounter: Payer: Self-pay | Admitting: Family Medicine

## 2017-07-09 ENCOUNTER — Ambulatory Visit
Admission: RE | Admit: 2017-07-09 | Discharge: 2017-07-09 | Disposition: A | Discharge status: Home / Self Care | Payer: BC Managed Care – PPO | Source: Ambulatory Visit | Attending: Family Medicine | Admitting: Family Medicine

## 2017-07-09 ENCOUNTER — Other Ambulatory Visit: Payer: Self-pay | Admitting: Family Medicine

## 2017-07-09 DIAGNOSIS — H9202 Otalgia, left ear: Secondary | ICD-10-CM

## 2017-12-01 ENCOUNTER — Encounter: Payer: Self-pay | Admitting: Allergy and Immunology

## 2019-06-01 ENCOUNTER — Other Ambulatory Visit: Payer: Self-pay | Admitting: Family Medicine

## 2019-06-01 DIAGNOSIS — Z1231 Encounter for screening mammogram for malignant neoplasm of breast: Secondary | ICD-10-CM

## 2019-06-01 DIAGNOSIS — M8580 Other specified disorders of bone density and structure, unspecified site: Secondary | ICD-10-CM

## 2019-08-11 ENCOUNTER — Ambulatory Visit
Admission: RE | Admit: 2019-08-11 | Discharge: 2019-08-11 | Disposition: A | Discharge status: Home / Self Care | Payer: BC Managed Care – PPO | Source: Ambulatory Visit | Attending: Family Medicine | Admitting: Family Medicine

## 2019-08-11 ENCOUNTER — Other Ambulatory Visit: Payer: Self-pay

## 2019-08-11 ENCOUNTER — Ambulatory Visit
Admission: RE | Admit: 2019-08-11 | Discharge: 2019-08-11 | Disposition: A | Discharge status: Home / Self Care | Payer: Medicare Other | Source: Ambulatory Visit | Attending: Family Medicine | Admitting: Family Medicine

## 2019-08-11 DIAGNOSIS — M8580 Other specified disorders of bone density and structure, unspecified site: Secondary | ICD-10-CM

## 2019-08-11 DIAGNOSIS — Z1231 Encounter for screening mammogram for malignant neoplasm of breast: Secondary | ICD-10-CM

## 2020-04-11 DIAGNOSIS — E7800 Pure hypercholesterolemia, unspecified: Secondary | ICD-10-CM | POA: Diagnosis not present

## 2020-04-11 DIAGNOSIS — Z03818 Encounter for observation for suspected exposure to other biological agents ruled out: Secondary | ICD-10-CM | POA: Diagnosis not present

## 2020-04-11 DIAGNOSIS — Z20828 Contact with and (suspected) exposure to other viral communicable diseases: Secondary | ICD-10-CM | POA: Diagnosis not present

## 2020-04-19 DIAGNOSIS — Z03818 Encounter for observation for suspected exposure to other biological agents ruled out: Secondary | ICD-10-CM | POA: Diagnosis not present

## 2020-04-19 DIAGNOSIS — Z20828 Contact with and (suspected) exposure to other viral communicable diseases: Secondary | ICD-10-CM | POA: Diagnosis not present

## 2020-04-20 DIAGNOSIS — E559 Vitamin D deficiency, unspecified: Secondary | ICD-10-CM | POA: Diagnosis not present

## 2020-04-20 DIAGNOSIS — F339 Major depressive disorder, recurrent, unspecified: Secondary | ICD-10-CM | POA: Diagnosis not present

## 2020-04-20 DIAGNOSIS — Z20822 Contact with and (suspected) exposure to covid-19: Secondary | ICD-10-CM | POA: Diagnosis not present

## 2020-04-20 DIAGNOSIS — R5381 Other malaise: Secondary | ICD-10-CM | POA: Diagnosis not present

## 2020-04-20 DIAGNOSIS — R42 Dizziness and giddiness: Secondary | ICD-10-CM | POA: Diagnosis not present

## 2020-04-20 DIAGNOSIS — R110 Nausea: Secondary | ICD-10-CM | POA: Diagnosis not present

## 2020-04-20 DIAGNOSIS — E7800 Pure hypercholesterolemia, unspecified: Secondary | ICD-10-CM | POA: Diagnosis not present

## 2020-04-20 DIAGNOSIS — F419 Anxiety disorder, unspecified: Secondary | ICD-10-CM | POA: Diagnosis not present

## 2020-04-20 DIAGNOSIS — Z131 Encounter for screening for diabetes mellitus: Secondary | ICD-10-CM | POA: Diagnosis not present

## 2020-04-26 DIAGNOSIS — R110 Nausea: Secondary | ICD-10-CM | POA: Diagnosis not present

## 2020-04-26 DIAGNOSIS — E7800 Pure hypercholesterolemia, unspecified: Secondary | ICD-10-CM | POA: Diagnosis not present

## 2020-04-26 DIAGNOSIS — R42 Dizziness and giddiness: Secondary | ICD-10-CM | POA: Diagnosis not present

## 2020-04-26 DIAGNOSIS — Z131 Encounter for screening for diabetes mellitus: Secondary | ICD-10-CM | POA: Diagnosis not present

## 2020-04-26 DIAGNOSIS — R5381 Other malaise: Secondary | ICD-10-CM | POA: Diagnosis not present

## 2020-04-26 DIAGNOSIS — F339 Major depressive disorder, recurrent, unspecified: Secondary | ICD-10-CM | POA: Diagnosis not present

## 2020-04-26 DIAGNOSIS — Z20822 Contact with and (suspected) exposure to covid-19: Secondary | ICD-10-CM | POA: Diagnosis not present

## 2020-04-26 DIAGNOSIS — F419 Anxiety disorder, unspecified: Secondary | ICD-10-CM | POA: Diagnosis not present

## 2020-04-26 DIAGNOSIS — E559 Vitamin D deficiency, unspecified: Secondary | ICD-10-CM | POA: Diagnosis not present

## 2020-05-17 DIAGNOSIS — E559 Vitamin D deficiency, unspecified: Secondary | ICD-10-CM | POA: Diagnosis not present

## 2020-05-17 DIAGNOSIS — E7800 Pure hypercholesterolemia, unspecified: Secondary | ICD-10-CM | POA: Diagnosis not present

## 2020-05-17 DIAGNOSIS — R7303 Prediabetes: Secondary | ICD-10-CM | POA: Diagnosis not present

## 2020-06-09 DIAGNOSIS — I8311 Varicose veins of right lower extremity with inflammation: Secondary | ICD-10-CM | POA: Diagnosis not present

## 2020-06-09 DIAGNOSIS — I8312 Varicose veins of left lower extremity with inflammation: Secondary | ICD-10-CM | POA: Diagnosis not present

## 2020-06-12 DIAGNOSIS — L821 Other seborrheic keratosis: Secondary | ICD-10-CM | POA: Diagnosis not present

## 2020-06-12 DIAGNOSIS — L578 Other skin changes due to chronic exposure to nonionizing radiation: Secondary | ICD-10-CM | POA: Diagnosis not present

## 2020-06-12 DIAGNOSIS — D225 Melanocytic nevi of trunk: Secondary | ICD-10-CM | POA: Diagnosis not present

## 2020-06-12 DIAGNOSIS — L281 Prurigo nodularis: Secondary | ICD-10-CM | POA: Diagnosis not present

## 2020-06-12 DIAGNOSIS — D2261 Melanocytic nevi of right upper limb, including shoulder: Secondary | ICD-10-CM | POA: Diagnosis not present

## 2020-06-12 DIAGNOSIS — L814 Other melanin hyperpigmentation: Secondary | ICD-10-CM | POA: Diagnosis not present

## 2020-08-03 DIAGNOSIS — R7303 Prediabetes: Secondary | ICD-10-CM | POA: Diagnosis not present

## 2020-08-03 DIAGNOSIS — E7800 Pure hypercholesterolemia, unspecified: Secondary | ICD-10-CM | POA: Diagnosis not present

## 2020-08-03 DIAGNOSIS — E559 Vitamin D deficiency, unspecified: Secondary | ICD-10-CM | POA: Diagnosis not present

## 2020-08-17 DIAGNOSIS — Z23 Encounter for immunization: Secondary | ICD-10-CM | POA: Diagnosis not present

## 2020-08-17 DIAGNOSIS — H43813 Vitreous degeneration, bilateral: Secondary | ICD-10-CM | POA: Diagnosis not present

## 2020-08-17 DIAGNOSIS — H3554 Dystrophies primarily involving the retinal pigment epithelium: Secondary | ICD-10-CM | POA: Diagnosis not present

## 2020-08-17 DIAGNOSIS — H25012 Cortical age-related cataract, left eye: Secondary | ICD-10-CM | POA: Diagnosis not present

## 2020-08-17 DIAGNOSIS — R7303 Prediabetes: Secondary | ICD-10-CM | POA: Diagnosis not present

## 2020-08-17 DIAGNOSIS — Z961 Presence of intraocular lens: Secondary | ICD-10-CM | POA: Diagnosis not present

## 2020-08-17 DIAGNOSIS — F419 Anxiety disorder, unspecified: Secondary | ICD-10-CM | POA: Diagnosis not present

## 2020-08-17 DIAGNOSIS — F339 Major depressive disorder, recurrent, unspecified: Secondary | ICD-10-CM | POA: Diagnosis not present

## 2020-08-17 DIAGNOSIS — H2512 Age-related nuclear cataract, left eye: Secondary | ICD-10-CM | POA: Diagnosis not present

## 2020-08-17 DIAGNOSIS — E782 Mixed hyperlipidemia: Secondary | ICD-10-CM | POA: Diagnosis not present

## 2020-09-21 DIAGNOSIS — H25012 Cortical age-related cataract, left eye: Secondary | ICD-10-CM | POA: Diagnosis not present

## 2020-09-21 DIAGNOSIS — H35411 Lattice degeneration of retina, right eye: Secondary | ICD-10-CM | POA: Diagnosis not present

## 2020-09-21 DIAGNOSIS — H2512 Age-related nuclear cataract, left eye: Secondary | ICD-10-CM | POA: Diagnosis not present

## 2020-09-21 DIAGNOSIS — H3554 Dystrophies primarily involving the retinal pigment epithelium: Secondary | ICD-10-CM | POA: Diagnosis not present

## 2020-09-28 ENCOUNTER — Other Ambulatory Visit: Payer: Self-pay | Admitting: Internal Medicine

## 2020-09-28 DIAGNOSIS — Z1231 Encounter for screening mammogram for malignant neoplasm of breast: Secondary | ICD-10-CM

## 2020-09-29 ENCOUNTER — Ambulatory Visit
Admission: RE | Admit: 2020-09-29 | Discharge: 2020-09-29 | Disposition: A | Discharge status: Home / Self Care | Payer: Medicare PPO | Source: Ambulatory Visit | Attending: Internal Medicine | Admitting: Internal Medicine

## 2020-09-29 ENCOUNTER — Other Ambulatory Visit: Payer: Self-pay

## 2020-09-29 DIAGNOSIS — Z1231 Encounter for screening mammogram for malignant neoplasm of breast: Secondary | ICD-10-CM | POA: Diagnosis not present

## 2020-10-18 DIAGNOSIS — H2512 Age-related nuclear cataract, left eye: Secondary | ICD-10-CM | POA: Diagnosis not present

## 2020-10-18 DIAGNOSIS — H25012 Cortical age-related cataract, left eye: Secondary | ICD-10-CM | POA: Diagnosis not present

## 2020-10-18 DIAGNOSIS — H25812 Combined forms of age-related cataract, left eye: Secondary | ICD-10-CM | POA: Diagnosis not present

## 2020-12-13 DIAGNOSIS — S46011A Strain of muscle(s) and tendon(s) of the rotator cuff of right shoulder, initial encounter: Secondary | ICD-10-CM | POA: Diagnosis not present

## 2020-12-22 DIAGNOSIS — E559 Vitamin D deficiency, unspecified: Secondary | ICD-10-CM | POA: Diagnosis not present

## 2020-12-22 DIAGNOSIS — R7303 Prediabetes: Secondary | ICD-10-CM | POA: Diagnosis not present

## 2020-12-22 DIAGNOSIS — E782 Mixed hyperlipidemia: Secondary | ICD-10-CM | POA: Diagnosis not present

## 2020-12-27 DIAGNOSIS — N390 Urinary tract infection, site not specified: Secondary | ICD-10-CM | POA: Diagnosis not present

## 2020-12-27 DIAGNOSIS — E559 Vitamin D deficiency, unspecified: Secondary | ICD-10-CM | POA: Diagnosis not present

## 2020-12-27 DIAGNOSIS — Z1331 Encounter for screening for depression: Secondary | ICD-10-CM | POA: Diagnosis not present

## 2020-12-27 DIAGNOSIS — Z23 Encounter for immunization: Secondary | ICD-10-CM | POA: Diagnosis not present

## 2020-12-27 DIAGNOSIS — R32 Unspecified urinary incontinence: Secondary | ICD-10-CM | POA: Diagnosis not present

## 2020-12-27 DIAGNOSIS — M25519 Pain in unspecified shoulder: Secondary | ICD-10-CM | POA: Diagnosis not present

## 2020-12-27 DIAGNOSIS — Z0000 Encounter for general adult medical examination without abnormal findings: Secondary | ICD-10-CM | POA: Diagnosis not present

## 2020-12-27 DIAGNOSIS — F419 Anxiety disorder, unspecified: Secondary | ICD-10-CM | POA: Diagnosis not present

## 2020-12-27 DIAGNOSIS — M8588 Other specified disorders of bone density and structure, other site: Secondary | ICD-10-CM | POA: Diagnosis not present

## 2020-12-27 DIAGNOSIS — E782 Mixed hyperlipidemia: Secondary | ICD-10-CM | POA: Diagnosis not present

## 2021-01-08 DIAGNOSIS — S46011D Strain of muscle(s) and tendon(s) of the rotator cuff of right shoulder, subsequent encounter: Secondary | ICD-10-CM | POA: Diagnosis not present

## 2021-01-15 DIAGNOSIS — M25511 Pain in right shoulder: Secondary | ICD-10-CM | POA: Diagnosis not present

## 2021-01-17 DIAGNOSIS — S46011A Strain of muscle(s) and tendon(s) of the rotator cuff of right shoulder, initial encounter: Secondary | ICD-10-CM | POA: Diagnosis not present

## 2021-01-18 DIAGNOSIS — M6281 Muscle weakness (generalized): Secondary | ICD-10-CM | POA: Diagnosis not present

## 2021-01-18 DIAGNOSIS — M25511 Pain in right shoulder: Secondary | ICD-10-CM | POA: Diagnosis not present

## 2021-01-18 DIAGNOSIS — M25611 Stiffness of right shoulder, not elsewhere classified: Secondary | ICD-10-CM | POA: Diagnosis not present

## 2021-01-18 DIAGNOSIS — M75101 Unspecified rotator cuff tear or rupture of right shoulder, not specified as traumatic: Secondary | ICD-10-CM | POA: Diagnosis not present

## 2021-01-23 DIAGNOSIS — M6281 Muscle weakness (generalized): Secondary | ICD-10-CM | POA: Diagnosis not present

## 2021-01-23 DIAGNOSIS — M75101 Unspecified rotator cuff tear or rupture of right shoulder, not specified as traumatic: Secondary | ICD-10-CM | POA: Diagnosis not present

## 2021-01-23 DIAGNOSIS — M25611 Stiffness of right shoulder, not elsewhere classified: Secondary | ICD-10-CM | POA: Diagnosis not present

## 2021-01-23 DIAGNOSIS — M25511 Pain in right shoulder: Secondary | ICD-10-CM | POA: Diagnosis not present

## 2021-02-14 DIAGNOSIS — S46011A Strain of muscle(s) and tendon(s) of the rotator cuff of right shoulder, initial encounter: Secondary | ICD-10-CM | POA: Diagnosis not present

## 2021-05-24 DIAGNOSIS — H3554 Dystrophies primarily involving the retinal pigment epithelium: Secondary | ICD-10-CM | POA: Diagnosis not present

## 2021-05-24 DIAGNOSIS — H04123 Dry eye syndrome of bilateral lacrimal glands: Secondary | ICD-10-CM | POA: Diagnosis not present

## 2021-07-11 DIAGNOSIS — R7303 Prediabetes: Secondary | ICD-10-CM | POA: Diagnosis not present

## 2021-07-11 DIAGNOSIS — M8588 Other specified disorders of bone density and structure, other site: Secondary | ICD-10-CM | POA: Diagnosis not present

## 2021-07-11 DIAGNOSIS — R32 Unspecified urinary incontinence: Secondary | ICD-10-CM | POA: Diagnosis not present

## 2021-07-11 DIAGNOSIS — E782 Mixed hyperlipidemia: Secondary | ICD-10-CM | POA: Diagnosis not present

## 2021-07-13 ENCOUNTER — Other Ambulatory Visit: Payer: Self-pay | Admitting: Internal Medicine

## 2021-07-13 DIAGNOSIS — M8588 Other specified disorders of bone density and structure, other site: Secondary | ICD-10-CM

## 2021-11-05 ENCOUNTER — Other Ambulatory Visit: Payer: Self-pay | Admitting: Internal Medicine

## 2021-11-05 DIAGNOSIS — Z1231 Encounter for screening mammogram for malignant neoplasm of breast: Secondary | ICD-10-CM

## 2021-12-19 DIAGNOSIS — H00022 Hordeolum internum right lower eyelid: Secondary | ICD-10-CM | POA: Diagnosis not present

## 2021-12-19 DIAGNOSIS — H0102B Squamous blepharitis left eye, upper and lower eyelids: Secondary | ICD-10-CM | POA: Diagnosis not present

## 2021-12-19 DIAGNOSIS — H0102A Squamous blepharitis right eye, upper and lower eyelids: Secondary | ICD-10-CM | POA: Diagnosis not present

## 2022-01-08 DIAGNOSIS — R7303 Prediabetes: Secondary | ICD-10-CM | POA: Diagnosis not present

## 2022-01-08 DIAGNOSIS — E782 Mixed hyperlipidemia: Secondary | ICD-10-CM | POA: Diagnosis not present

## 2022-01-08 DIAGNOSIS — R7989 Other specified abnormal findings of blood chemistry: Secondary | ICD-10-CM | POA: Diagnosis not present

## 2022-01-08 DIAGNOSIS — E559 Vitamin D deficiency, unspecified: Secondary | ICD-10-CM | POA: Diagnosis not present

## 2022-01-10 DIAGNOSIS — H00022 Hordeolum internum right lower eyelid: Secondary | ICD-10-CM | POA: Diagnosis not present

## 2022-01-10 DIAGNOSIS — H0102B Squamous blepharitis left eye, upper and lower eyelids: Secondary | ICD-10-CM | POA: Diagnosis not present

## 2022-01-10 DIAGNOSIS — H0102A Squamous blepharitis right eye, upper and lower eyelids: Secondary | ICD-10-CM | POA: Diagnosis not present

## 2022-01-15 DIAGNOSIS — R32 Unspecified urinary incontinence: Secondary | ICD-10-CM | POA: Diagnosis not present

## 2022-01-15 DIAGNOSIS — Z23 Encounter for immunization: Secondary | ICD-10-CM | POA: Diagnosis not present

## 2022-01-15 DIAGNOSIS — R7303 Prediabetes: Secondary | ICD-10-CM | POA: Diagnosis not present

## 2022-01-15 DIAGNOSIS — M8588 Other specified disorders of bone density and structure, other site: Secondary | ICD-10-CM | POA: Diagnosis not present

## 2022-01-15 DIAGNOSIS — Z1339 Encounter for screening examination for other mental health and behavioral disorders: Secondary | ICD-10-CM | POA: Diagnosis not present

## 2022-01-15 DIAGNOSIS — Z0000 Encounter for general adult medical examination without abnormal findings: Secondary | ICD-10-CM | POA: Diagnosis not present

## 2022-01-15 DIAGNOSIS — R7989 Other specified abnormal findings of blood chemistry: Secondary | ICD-10-CM | POA: Diagnosis not present

## 2022-01-15 DIAGNOSIS — F419 Anxiety disorder, unspecified: Secondary | ICD-10-CM | POA: Diagnosis not present

## 2022-01-15 DIAGNOSIS — N644 Mastodynia: Secondary | ICD-10-CM | POA: Diagnosis not present

## 2022-01-15 DIAGNOSIS — Z1159 Encounter for screening for other viral diseases: Secondary | ICD-10-CM | POA: Diagnosis not present

## 2022-01-15 DIAGNOSIS — E663 Overweight: Secondary | ICD-10-CM | POA: Diagnosis not present

## 2022-01-15 DIAGNOSIS — Z1331 Encounter for screening for depression: Secondary | ICD-10-CM | POA: Diagnosis not present

## 2022-01-15 DIAGNOSIS — F339 Major depressive disorder, recurrent, unspecified: Secondary | ICD-10-CM | POA: Diagnosis not present

## 2022-01-15 DIAGNOSIS — E782 Mixed hyperlipidemia: Secondary | ICD-10-CM | POA: Diagnosis not present

## 2022-01-16 ENCOUNTER — Ambulatory Visit
Admission: RE | Admit: 2022-01-16 | Discharge: 2022-01-16 | Disposition: A | Discharge status: Home / Self Care | Payer: Medicare PPO | Source: Ambulatory Visit | Attending: Internal Medicine | Admitting: Internal Medicine

## 2022-01-16 DIAGNOSIS — M85852 Other specified disorders of bone density and structure, left thigh: Secondary | ICD-10-CM | POA: Diagnosis not present

## 2022-01-16 DIAGNOSIS — Z1231 Encounter for screening mammogram for malignant neoplasm of breast: Secondary | ICD-10-CM

## 2022-01-16 DIAGNOSIS — Z780 Asymptomatic menopausal state: Secondary | ICD-10-CM | POA: Diagnosis not present

## 2022-01-16 DIAGNOSIS — M8588 Other specified disorders of bone density and structure, other site: Secondary | ICD-10-CM

## 2022-01-16 DIAGNOSIS — M810 Age-related osteoporosis without current pathological fracture: Secondary | ICD-10-CM | POA: Diagnosis not present

## 2022-01-21 ENCOUNTER — Other Ambulatory Visit: Payer: Self-pay | Admitting: Internal Medicine

## 2022-01-21 DIAGNOSIS — N644 Mastodynia: Secondary | ICD-10-CM

## 2022-01-28 DIAGNOSIS — H00022 Hordeolum internum right lower eyelid: Secondary | ICD-10-CM | POA: Diagnosis not present

## 2022-01-28 DIAGNOSIS — H0102A Squamous blepharitis right eye, upper and lower eyelids: Secondary | ICD-10-CM | POA: Diagnosis not present

## 2022-01-31 DIAGNOSIS — S63502A Unspecified sprain of left wrist, initial encounter: Secondary | ICD-10-CM | POA: Diagnosis not present

## 2022-03-05 ENCOUNTER — Telehealth: Payer: Self-pay | Admitting: Family Medicine

## 2022-03-05 NOTE — Telephone Encounter (Signed)
Pt was referred, called but didn't answer, left voicemail. ?

## 2022-03-06 NOTE — Telephone Encounter (Signed)
Patient returned phone call. VM left on nurse line.  ?

## 2022-03-08 DIAGNOSIS — M542 Cervicalgia: Secondary | ICD-10-CM | POA: Diagnosis not present

## 2022-06-09 DIAGNOSIS — F419 Anxiety disorder, unspecified: Secondary | ICD-10-CM | POA: Insufficient documentation

## 2022-06-09 DIAGNOSIS — J309 Allergic rhinitis, unspecified: Secondary | ICD-10-CM | POA: Insufficient documentation

## 2022-06-09 DIAGNOSIS — E559 Vitamin D deficiency, unspecified: Secondary | ICD-10-CM | POA: Insufficient documentation

## 2022-06-09 DIAGNOSIS — J329 Chronic sinusitis, unspecified: Secondary | ICD-10-CM | POA: Insufficient documentation

## 2022-06-09 DIAGNOSIS — F339 Major depressive disorder, recurrent, unspecified: Secondary | ICD-10-CM | POA: Insufficient documentation

## 2022-06-09 DIAGNOSIS — M8588 Other specified disorders of bone density and structure, other site: Secondary | ICD-10-CM | POA: Insufficient documentation

## 2022-06-09 DIAGNOSIS — E7800 Pure hypercholesterolemia, unspecified: Secondary | ICD-10-CM | POA: Insufficient documentation

## 2022-06-09 DIAGNOSIS — R7303 Prediabetes: Secondary | ICD-10-CM | POA: Insufficient documentation

## 2022-06-09 NOTE — Progress Notes (Deleted)
   GYNECOLOGY OFFICE VISIT NOTE  History:   Melissa Bush is a 71 y.o. No obstetric history on file. here today for ***.   She denies any abnormal vaginal discharge, bleeding, pelvic pain or other concerns.     Past Medical History:  Diagnosis Date   Allergy    Arthritis    Cancer ([place])    Depression    Osteoporosis     Past Surgical History:  Procedure Laterality Date   CESAREAN SECTION     TUBAL LIGATION      The following portions of the patient's history were reviewed and updated as appropriate: allergies, current medications, past family history, past medical history, past social history, past surgical history and problem list.   Health Maintenance:   Normal pap and negative HRHPV on 12/2016  Normal mammogram on 12/2021.   Review of Systems:  Pertinent items noted in HPI and remainder of comprehensive ROS otherwise negative.  Physical Exam:  There were no vitals taken for this visit. CONSTITUTIONAL: Well-developed, well-nourished female in no acute distress.  HEENT:  Normocephalic, atraumatic. External right and left ear normal. No scleral icterus.  NECK: Normal range of motion, supple, no masses noted on observation SKIN: No rash noted. Not diaphoretic. No erythema. No pallor. MUSCULOSKELETAL: Normal range of motion. No edema noted. NEUROLOGIC: Alert and oriented to person, place, and time. Normal muscle tone coordination. No cranial nerve deficit noted. PSYCHIATRIC: Normal mood and affect. Normal behavior. Normal judgment and thought content.  CARDIOVASCULAR: Normal heart rate noted RESPIRATORY: Effort and breath sounds normal, no problems with respiration noted ABDOMEN: No masses noted. No other overt distention noted.    PELVIC: {Blank single:19197::"Deferred","Normal appearing external genitalia; normal urethral meatus; normal appearing vaginal mucosa and cervix.  No abnormal discharge noted.  Normal uterine size, no other palpable masses, no uterine or  adnexal tenderness. Performed in the presence of a chaperone"}  Labs and Imaging No results found for this or any previous visit (from the past 168 hour(s)). No results found.  Assessment and Plan:   1. Urinary incontinence, unspecified type ***    Diagnoses and all orders for this visit:  Urinary incontinence, unspecified type    Routine preventative health maintenance measures emphasized. Please refer to After Visit Summary for other counseling recommendations.   No follow-ups on file.  Radene Gunning, MD, [place] [facility], [place]

## 2022-06-14 ENCOUNTER — Encounter: Payer: Medicare PPO | Admitting: Obstetrics and Gynecology

## 2022-06-14 DIAGNOSIS — R32 Unspecified urinary incontinence: Secondary | ICD-10-CM

## 2022-10-08 IMAGING — MG MM DIGITAL SCREENING BILAT W/ TOMO AND CAD
6 of 10 series · 6 of 30 positions shown · non-contrast
Comparison: Previous exam(s).

CLINICAL DATA: Screening.

EXAM:
DIGITAL SCREENING BILATERAL MAMMOGRAM WITH TOMOSYNTHESIS AND CAD
TECHNIQUE: Bilateral screening digital craniocaudal and mediolateral oblique
mammograms were obtained. Bilateral screening digital breast
tomosynthesis was performed. The images were evaluated with
computer-aided detection.

[R CC synth-2D]
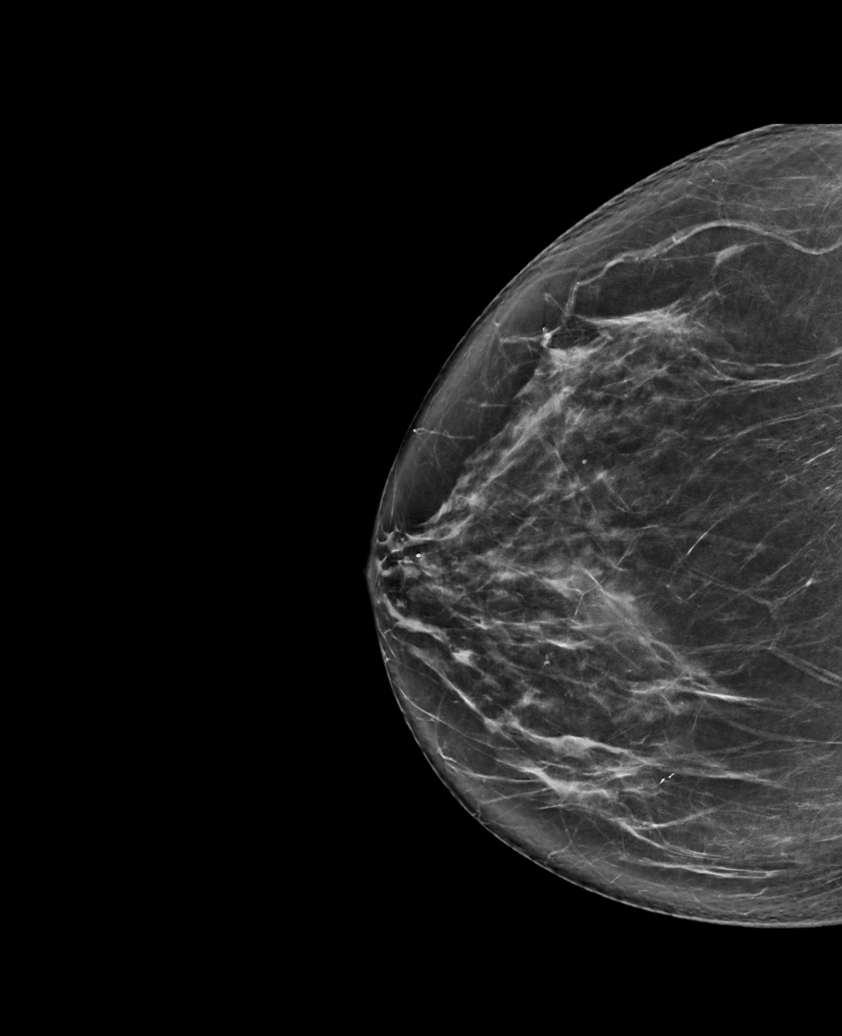

[R MLO synth-2D (1 of 2)]
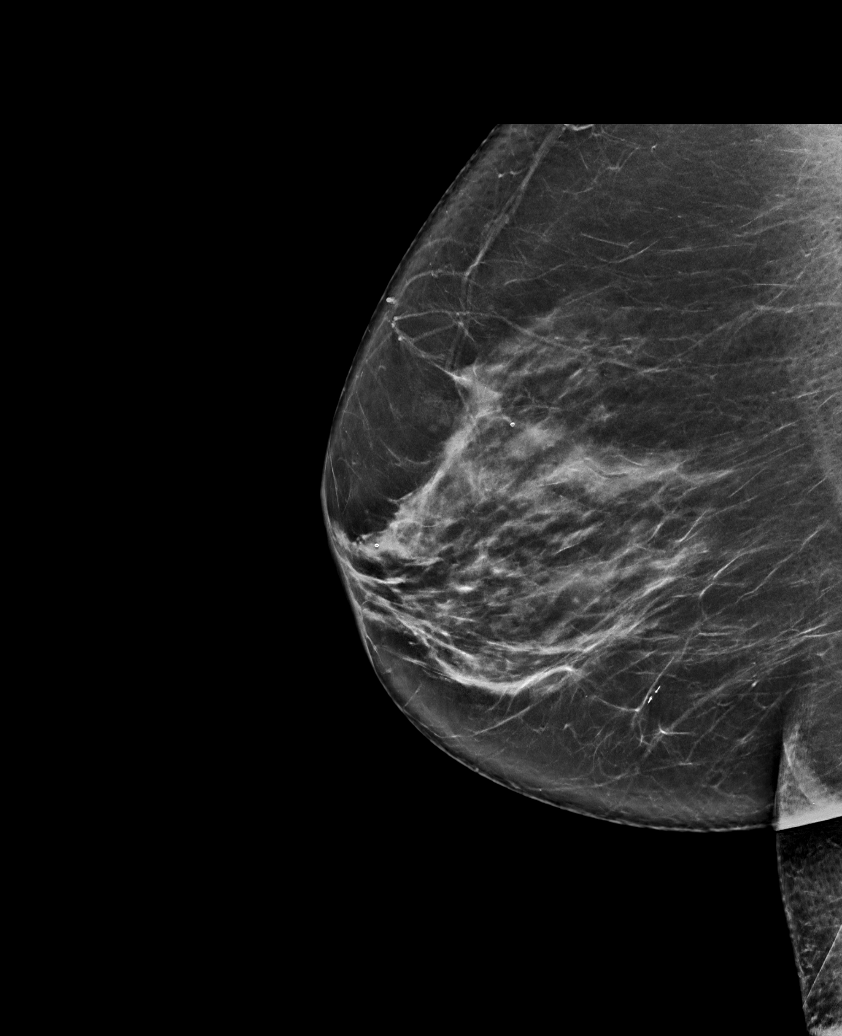

[R MLO synth-2D (2 of 2)]
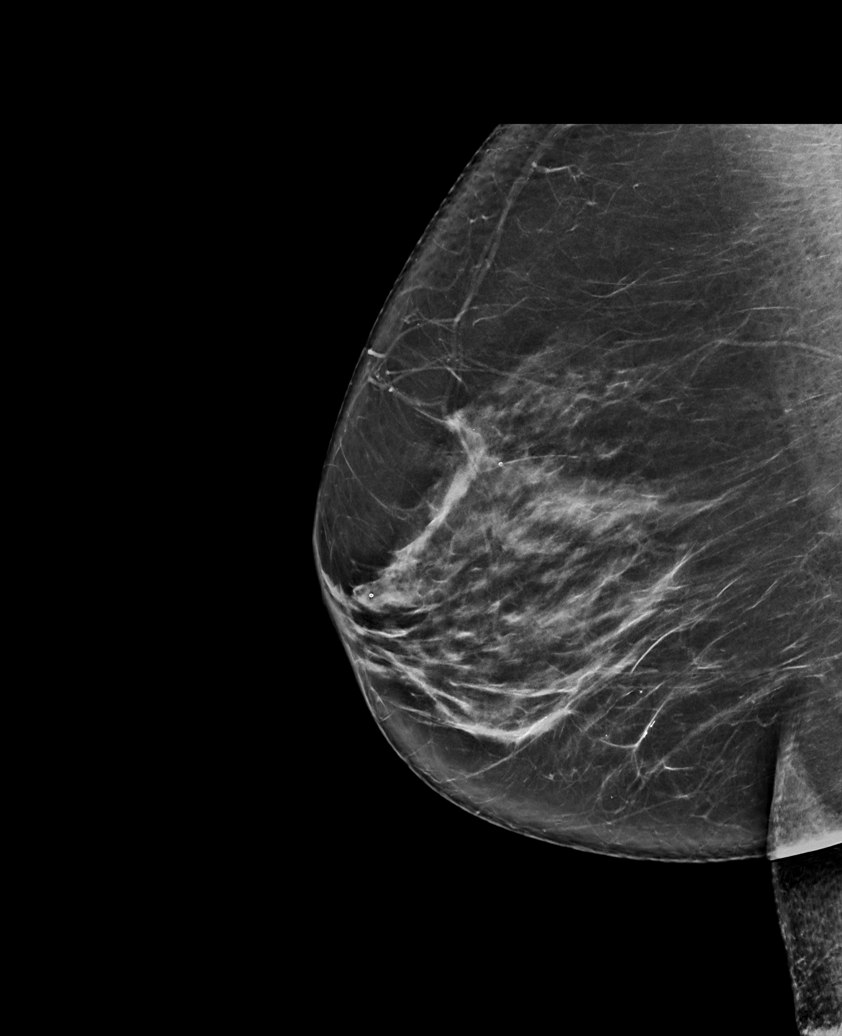

[L MLO synth-2D]
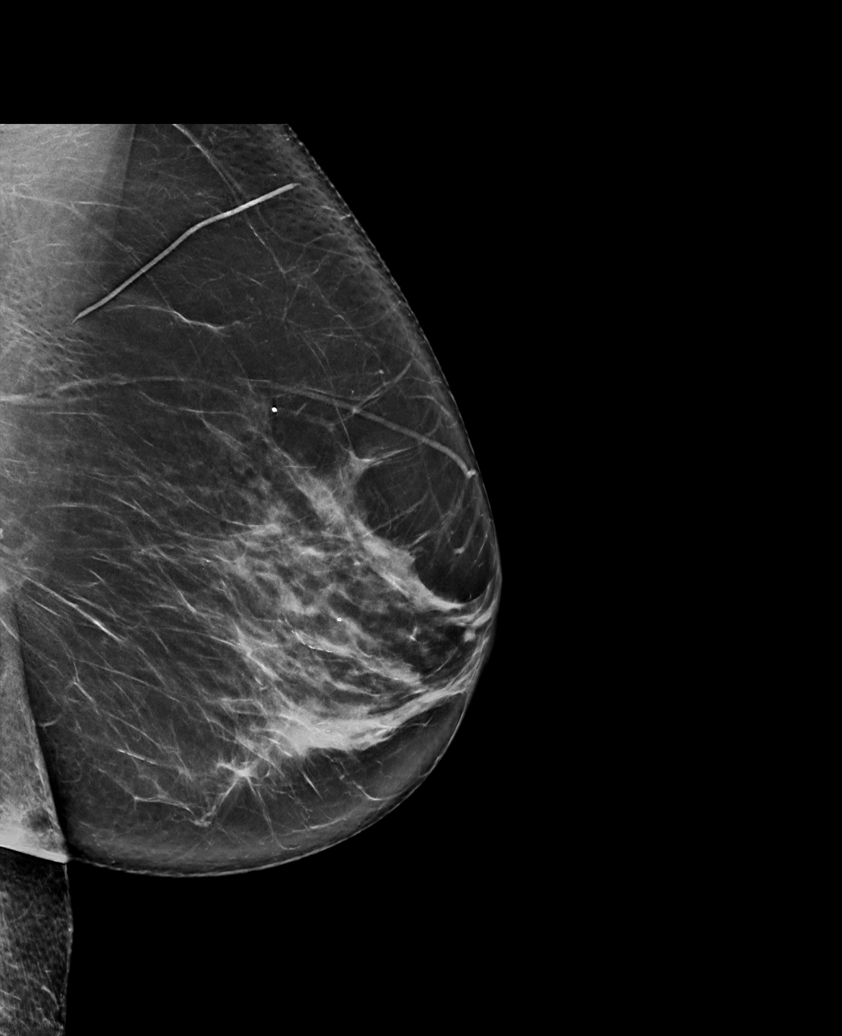

[L CC synth-2D]
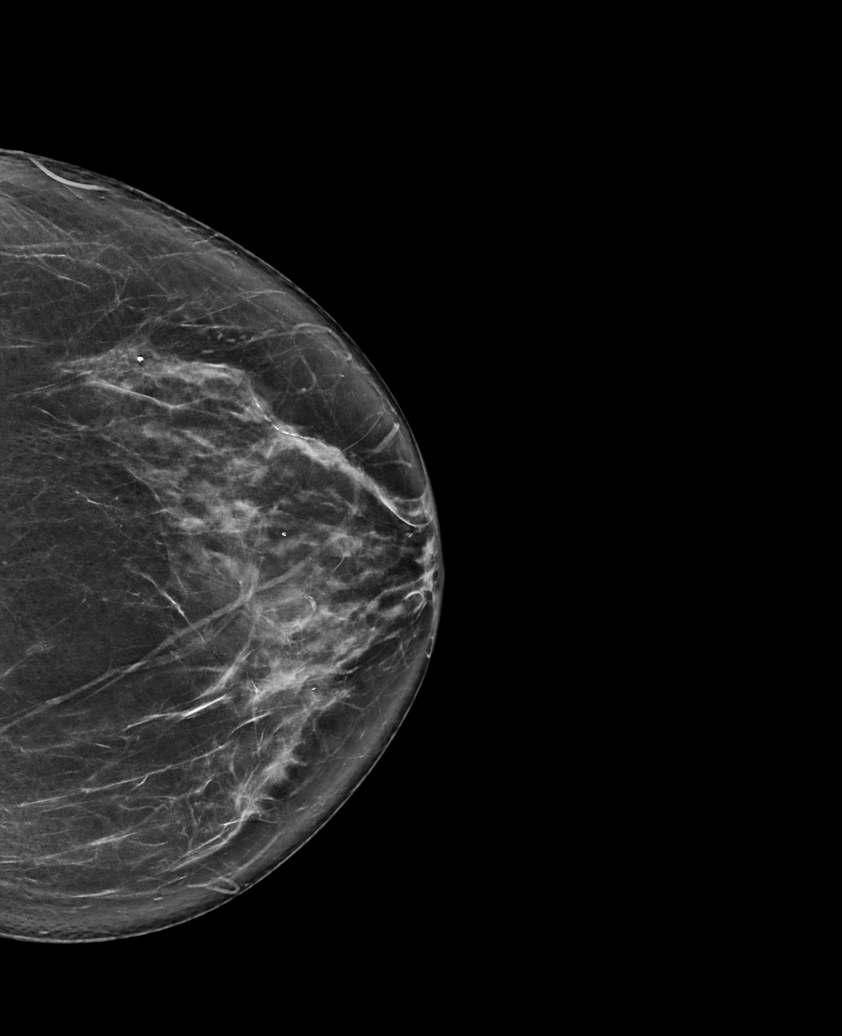

[R MLO tomo · tomo slice 43/85.0]
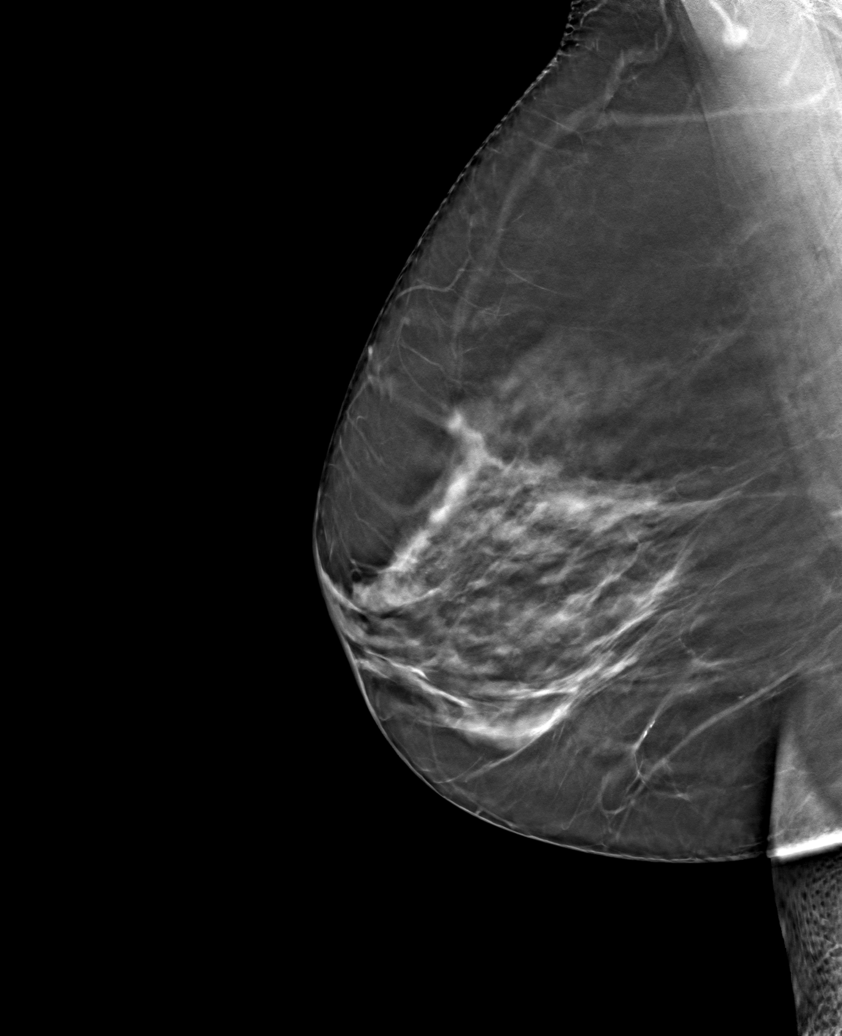

[6 of 30 positions shown; findings below may reference images not displayed]

ACR Breast Density Category c: The breast tissue is heterogeneously
dense, which may obscure small masses.
FINDINGS: There are no findings suspicious for malignancy.
IMPRESSION: No mammographic evidence of malignancy. A result letter of this
screening mammogram will be mailed directly to the patient.

RECOMMENDATION:
Screening mammogram in one year. (Code:Q3-W-BC3)

BI-RADS CATEGORY  1: Negative.

## 2023-03-26 ENCOUNTER — Other Ambulatory Visit: Payer: Self-pay | Admitting: Internal Medicine

## 2023-03-26 DIAGNOSIS — Z1231 Encounter for screening mammogram for malignant neoplasm of breast: Secondary | ICD-10-CM

## 2023-04-03 ENCOUNTER — Ambulatory Visit: Payer: Medicare PPO

## 2023-05-27 ENCOUNTER — Ambulatory Visit
Admission: RE | Admit: 2023-05-27 | Discharge: 2023-05-27 | Disposition: A | Discharge status: Home / Self Care | Payer: Medicare PPO | Source: Ambulatory Visit | Attending: Internal Medicine | Admitting: Internal Medicine

## 2023-05-27 ENCOUNTER — Ambulatory Visit: Payer: Medicare PPO

## 2023-05-27 DIAGNOSIS — Z1231 Encounter for screening mammogram for malignant neoplasm of breast: Secondary | ICD-10-CM

## 2023-07-02 ENCOUNTER — Other Ambulatory Visit (HOSPITAL_COMMUNITY): Payer: Self-pay | Admitting: *Deleted

## 2023-07-03 ENCOUNTER — Ambulatory Visit (HOSPITAL_COMMUNITY)
Admission: RE | Admit: 2023-07-03 | Discharge: 2023-07-03 | Disposition: A | Discharge status: Home / Self Care | Payer: Medicare PPO | Source: Ambulatory Visit

## 2023-07-03 DIAGNOSIS — M810 Age-related osteoporosis without current pathological fracture: Secondary | ICD-10-CM | POA: Insufficient documentation

## 2023-07-03 MED ORDER — ZOLEDRONIC ACID 5 MG/100ML IV SOLN
INTRAVENOUS | Status: AC
  Administered 2023-07-03: 5 mg via INTRAVENOUS
  Filled 2023-07-03: qty 100

## 2023-07-03 MED ORDER — ZOLEDRONIC ACID 5 MG/100ML IV SOLN: 5.0000 mg | Freq: Once | INTRAVENOUS | Status: AC

## 2023-08-01 DIAGNOSIS — S32010A Wedge compression fracture of first lumbar vertebra, initial encounter for closed fracture: Secondary | ICD-10-CM | POA: Diagnosis not present

## 2023-08-01 DIAGNOSIS — S32010G Wedge compression fracture of first lumbar vertebra, subsequent encounter for fracture with delayed healing: Secondary | ICD-10-CM | POA: Diagnosis not present

## 2023-08-01 DIAGNOSIS — S46011D Strain of muscle(s) and tendon(s) of the rotator cuff of right shoulder, subsequent encounter: Secondary | ICD-10-CM | POA: Diagnosis not present

## 2023-08-01 DIAGNOSIS — Z6825 Body mass index (BMI) 25.0-25.9, adult: Secondary | ICD-10-CM | POA: Diagnosis not present

## 2023-08-04 ENCOUNTER — Ambulatory Visit (HOSPITAL_COMMUNITY)
Admission: RE | Admit: 2023-08-04 | Discharge: 2023-08-04 | Disposition: A | Discharge status: Home / Self Care | Payer: Medicare PPO | Source: Ambulatory Visit | Attending: Internal Medicine | Admitting: Internal Medicine

## 2023-08-04 ENCOUNTER — Other Ambulatory Visit (HOSPITAL_COMMUNITY): Payer: Self-pay | Admitting: Surgery

## 2023-08-04 DIAGNOSIS — S32010G Wedge compression fracture of first lumbar vertebra, subsequent encounter for fracture with delayed healing: Secondary | ICD-10-CM

## 2023-08-04 DIAGNOSIS — M47817 Spondylosis without myelopathy or radiculopathy, lumbosacral region: Secondary | ICD-10-CM | POA: Diagnosis not present

## 2023-08-04 DIAGNOSIS — M4317 Spondylolisthesis, lumbosacral region: Secondary | ICD-10-CM | POA: Diagnosis not present

## 2023-08-04 DIAGNOSIS — M5136 Other intervertebral disc degeneration, lumbar region: Secondary | ICD-10-CM | POA: Diagnosis not present

## 2023-08-04 DIAGNOSIS — S32019A Unspecified fracture of first lumbar vertebra, initial encounter for closed fracture: Secondary | ICD-10-CM | POA: Diagnosis not present

## 2023-08-22 DIAGNOSIS — S32010G Wedge compression fracture of first lumbar vertebra, subsequent encounter for fracture with delayed healing: Secondary | ICD-10-CM | POA: Diagnosis not present

## 2023-08-22 DIAGNOSIS — Z6826 Body mass index (BMI) 26.0-26.9, adult: Secondary | ICD-10-CM | POA: Diagnosis not present

## 2023-08-26 DIAGNOSIS — M810 Age-related osteoporosis without current pathological fracture: Secondary | ICD-10-CM | POA: Diagnosis not present

## 2023-08-26 DIAGNOSIS — E782 Mixed hyperlipidemia: Secondary | ICD-10-CM | POA: Diagnosis not present

## 2023-08-26 DIAGNOSIS — F419 Anxiety disorder, unspecified: Secondary | ICD-10-CM | POA: Diagnosis not present

## 2023-08-26 DIAGNOSIS — Z23 Encounter for immunization: Secondary | ICD-10-CM | POA: Diagnosis not present

## 2023-08-26 DIAGNOSIS — R7303 Prediabetes: Secondary | ICD-10-CM | POA: Diagnosis not present

## 2023-08-26 DIAGNOSIS — F339 Major depressive disorder, recurrent, unspecified: Secondary | ICD-10-CM | POA: Diagnosis not present

## 2023-08-26 DIAGNOSIS — S32000D Wedge compression fracture of unspecified lumbar vertebra, subsequent encounter for fracture with routine healing: Secondary | ICD-10-CM | POA: Diagnosis not present

## 2023-10-02 DIAGNOSIS — L814 Other melanin hyperpigmentation: Secondary | ICD-10-CM | POA: Diagnosis not present

## 2023-10-02 DIAGNOSIS — L853 Xerosis cutis: Secondary | ICD-10-CM | POA: Diagnosis not present

## 2023-10-02 DIAGNOSIS — D2261 Melanocytic nevi of right upper limb, including shoulder: Secondary | ICD-10-CM | POA: Diagnosis not present

## 2023-10-02 DIAGNOSIS — D225 Melanocytic nevi of trunk: Secondary | ICD-10-CM | POA: Diagnosis not present

## 2023-10-02 DIAGNOSIS — L821 Other seborrheic keratosis: Secondary | ICD-10-CM | POA: Diagnosis not present

## 2023-10-02 DIAGNOSIS — L988 Other specified disorders of the skin and subcutaneous tissue: Secondary | ICD-10-CM | POA: Diagnosis not present

## 2023-10-02 DIAGNOSIS — L578 Other skin changes due to chronic exposure to nonionizing radiation: Secondary | ICD-10-CM | POA: Diagnosis not present

## 2023-10-20 DIAGNOSIS — M1812 Unilateral primary osteoarthritis of first carpometacarpal joint, left hand: Secondary | ICD-10-CM | POA: Diagnosis not present

## 2023-10-20 DIAGNOSIS — M5432 Sciatica, left side: Secondary | ICD-10-CM | POA: Diagnosis not present

## 2023-11-07 DIAGNOSIS — M542 Cervicalgia: Secondary | ICD-10-CM | POA: Diagnosis not present

## 2023-11-07 DIAGNOSIS — M25511 Pain in right shoulder: Secondary | ICD-10-CM | POA: Diagnosis not present

## 2023-11-18 DIAGNOSIS — J069 Acute upper respiratory infection, unspecified: Secondary | ICD-10-CM | POA: Diagnosis not present

## 2023-11-20 DIAGNOSIS — J018 Other acute sinusitis: Secondary | ICD-10-CM | POA: Diagnosis not present

## 2023-12-05 DIAGNOSIS — M25511 Pain in right shoulder: Secondary | ICD-10-CM | POA: Diagnosis not present

## 2023-12-12 DIAGNOSIS — M25511 Pain in right shoulder: Secondary | ICD-10-CM | POA: Diagnosis not present

## 2023-12-26 ENCOUNTER — Other Ambulatory Visit: Payer: Self-pay | Admitting: Orthopedic Surgery

## 2023-12-26 DIAGNOSIS — M25511 Pain in right shoulder: Secondary | ICD-10-CM | POA: Diagnosis not present

## 2023-12-26 DIAGNOSIS — G8929 Other chronic pain: Secondary | ICD-10-CM

## 2023-12-31 ENCOUNTER — Ambulatory Visit
Admission: RE | Admit: 2023-12-31 | Discharge: 2023-12-31 | Disposition: A | Discharge status: Home / Self Care | Payer: Medicare PPO | Source: Ambulatory Visit | Attending: Orthopedic Surgery | Admitting: Orthopedic Surgery

## 2023-12-31 DIAGNOSIS — G8929 Other chronic pain: Secondary | ICD-10-CM

## 2023-12-31 DIAGNOSIS — M25511 Pain in right shoulder: Secondary | ICD-10-CM | POA: Diagnosis not present

## 2023-12-31 DIAGNOSIS — M19011 Primary osteoarthritis, right shoulder: Secondary | ICD-10-CM | POA: Diagnosis not present

## 2024-02-04 DIAGNOSIS — M25511 Pain in right shoulder: Secondary | ICD-10-CM | POA: Diagnosis not present

## 2024-02-04 DIAGNOSIS — S46011A Strain of muscle(s) and tendon(s) of the rotator cuff of right shoulder, initial encounter: Secondary | ICD-10-CM | POA: Diagnosis not present

## 2024-02-13 DIAGNOSIS — R630 Anorexia: Secondary | ICD-10-CM | POA: Diagnosis not present

## 2024-02-13 DIAGNOSIS — K529 Noninfective gastroenteritis and colitis, unspecified: Secondary | ICD-10-CM | POA: Diagnosis not present

## 2024-02-13 DIAGNOSIS — R110 Nausea: Secondary | ICD-10-CM | POA: Diagnosis not present

## 2024-02-17 DIAGNOSIS — R7303 Prediabetes: Secondary | ICD-10-CM | POA: Diagnosis not present

## 2024-02-17 DIAGNOSIS — Z01818 Encounter for other preprocedural examination: Secondary | ICD-10-CM | POA: Diagnosis not present

## 2024-02-17 DIAGNOSIS — M810 Age-related osteoporosis without current pathological fracture: Secondary | ICD-10-CM | POA: Diagnosis not present

## 2024-02-17 DIAGNOSIS — M25511 Pain in right shoulder: Secondary | ICD-10-CM | POA: Diagnosis not present

## 2024-02-17 DIAGNOSIS — E782 Mixed hyperlipidemia: Secondary | ICD-10-CM | POA: Diagnosis not present

## 2024-02-19 DIAGNOSIS — M75111 Incomplete rotator cuff tear or rupture of right shoulder, not specified as traumatic: Secondary | ICD-10-CM | POA: Diagnosis not present

## 2024-02-19 DIAGNOSIS — M19011 Primary osteoarthritis, right shoulder: Secondary | ICD-10-CM | POA: Diagnosis not present

## 2024-02-19 DIAGNOSIS — G8918 Other acute postprocedural pain: Secondary | ICD-10-CM | POA: Diagnosis not present

## 2024-02-23 DIAGNOSIS — M6281 Muscle weakness (generalized): Secondary | ICD-10-CM | POA: Diagnosis not present

## 2024-02-23 DIAGNOSIS — M19011 Primary osteoarthritis, right shoulder: Secondary | ICD-10-CM | POA: Diagnosis not present

## 2024-02-23 DIAGNOSIS — M25611 Stiffness of right shoulder, not elsewhere classified: Secondary | ICD-10-CM | POA: Diagnosis not present

## 2024-03-02 DIAGNOSIS — M25611 Stiffness of right shoulder, not elsewhere classified: Secondary | ICD-10-CM | POA: Diagnosis not present

## 2024-03-02 DIAGNOSIS — M6281 Muscle weakness (generalized): Secondary | ICD-10-CM | POA: Diagnosis not present

## 2024-03-02 DIAGNOSIS — M19011 Primary osteoarthritis, right shoulder: Secondary | ICD-10-CM | POA: Diagnosis not present

## 2024-03-03 DIAGNOSIS — M19011 Primary osteoarthritis, right shoulder: Secondary | ICD-10-CM | POA: Diagnosis not present

## 2024-03-08 DIAGNOSIS — M19011 Primary osteoarthritis, right shoulder: Secondary | ICD-10-CM | POA: Diagnosis not present

## 2024-03-08 DIAGNOSIS — M25611 Stiffness of right shoulder, not elsewhere classified: Secondary | ICD-10-CM | POA: Diagnosis not present

## 2024-03-08 DIAGNOSIS — M6281 Muscle weakness (generalized): Secondary | ICD-10-CM | POA: Diagnosis not present

## 2024-03-15 DIAGNOSIS — M6281 Muscle weakness (generalized): Secondary | ICD-10-CM | POA: Diagnosis not present

## 2024-03-15 DIAGNOSIS — M25611 Stiffness of right shoulder, not elsewhere classified: Secondary | ICD-10-CM | POA: Diagnosis not present

## 2024-03-15 DIAGNOSIS — M19011 Primary osteoarthritis, right shoulder: Secondary | ICD-10-CM | POA: Diagnosis not present

## 2024-03-22 DIAGNOSIS — M6281 Muscle weakness (generalized): Secondary | ICD-10-CM | POA: Diagnosis not present

## 2024-03-22 DIAGNOSIS — M25611 Stiffness of right shoulder, not elsewhere classified: Secondary | ICD-10-CM | POA: Diagnosis not present

## 2024-03-22 DIAGNOSIS — M19011 Primary osteoarthritis, right shoulder: Secondary | ICD-10-CM | POA: Diagnosis not present

## 2024-03-29 DIAGNOSIS — M25611 Stiffness of right shoulder, not elsewhere classified: Secondary | ICD-10-CM | POA: Diagnosis not present

## 2024-03-29 DIAGNOSIS — M19011 Primary osteoarthritis, right shoulder: Secondary | ICD-10-CM | POA: Diagnosis not present

## 2024-03-29 DIAGNOSIS — M6281 Muscle weakness (generalized): Secondary | ICD-10-CM | POA: Diagnosis not present

## 2024-04-05 DIAGNOSIS — M6281 Muscle weakness (generalized): Secondary | ICD-10-CM | POA: Diagnosis not present

## 2024-04-05 DIAGNOSIS — M25611 Stiffness of right shoulder, not elsewhere classified: Secondary | ICD-10-CM | POA: Diagnosis not present

## 2024-04-05 DIAGNOSIS — M19011 Primary osteoarthritis, right shoulder: Secondary | ICD-10-CM | POA: Diagnosis not present

## 2024-04-12 DIAGNOSIS — M25611 Stiffness of right shoulder, not elsewhere classified: Secondary | ICD-10-CM | POA: Diagnosis not present

## 2024-04-12 DIAGNOSIS — M6281 Muscle weakness (generalized): Secondary | ICD-10-CM | POA: Diagnosis not present

## 2024-04-12 DIAGNOSIS — M19011 Primary osteoarthritis, right shoulder: Secondary | ICD-10-CM | POA: Diagnosis not present

## 2024-04-20 DIAGNOSIS — M25611 Stiffness of right shoulder, not elsewhere classified: Secondary | ICD-10-CM | POA: Diagnosis not present

## 2024-04-20 DIAGNOSIS — M19011 Primary osteoarthritis, right shoulder: Secondary | ICD-10-CM | POA: Diagnosis not present

## 2024-04-20 DIAGNOSIS — M6281 Muscle weakness (generalized): Secondary | ICD-10-CM | POA: Diagnosis not present

## 2024-04-22 DIAGNOSIS — K5909 Other constipation: Secondary | ICD-10-CM | POA: Diagnosis not present

## 2024-04-22 DIAGNOSIS — Z79899 Other long term (current) drug therapy: Secondary | ICD-10-CM | POA: Diagnosis not present

## 2024-04-22 DIAGNOSIS — E785 Hyperlipidemia, unspecified: Secondary | ICD-10-CM | POA: Diagnosis not present

## 2024-04-22 DIAGNOSIS — M810 Age-related osteoporosis without current pathological fracture: Secondary | ICD-10-CM | POA: Diagnosis not present

## 2024-04-22 DIAGNOSIS — E559 Vitamin D deficiency, unspecified: Secondary | ICD-10-CM | POA: Diagnosis not present

## 2024-04-22 DIAGNOSIS — E782 Mixed hyperlipidemia: Secondary | ICD-10-CM | POA: Diagnosis not present

## 2024-04-22 DIAGNOSIS — R7303 Prediabetes: Secondary | ICD-10-CM | POA: Diagnosis not present

## 2024-04-26 DIAGNOSIS — L650 Telogen effluvium: Secondary | ICD-10-CM | POA: Diagnosis not present

## 2024-04-26 DIAGNOSIS — M19011 Primary osteoarthritis, right shoulder: Secondary | ICD-10-CM | POA: Diagnosis not present

## 2024-04-26 DIAGNOSIS — M25611 Stiffness of right shoulder, not elsewhere classified: Secondary | ICD-10-CM | POA: Diagnosis not present

## 2024-04-26 DIAGNOSIS — M6281 Muscle weakness (generalized): Secondary | ICD-10-CM | POA: Diagnosis not present

## 2024-04-29 DIAGNOSIS — M810 Age-related osteoporosis without current pathological fracture: Secondary | ICD-10-CM | POA: Diagnosis not present

## 2024-04-29 DIAGNOSIS — F419 Anxiety disorder, unspecified: Secondary | ICD-10-CM | POA: Diagnosis not present

## 2024-04-29 DIAGNOSIS — E559 Vitamin D deficiency, unspecified: Secondary | ICD-10-CM | POA: Diagnosis not present

## 2024-04-29 DIAGNOSIS — Z1331 Encounter for screening for depression: Secondary | ICD-10-CM | POA: Diagnosis not present

## 2024-04-29 DIAGNOSIS — Z1339 Encounter for screening examination for other mental health and behavioral disorders: Secondary | ICD-10-CM | POA: Diagnosis not present

## 2024-04-29 DIAGNOSIS — M48061 Spinal stenosis, lumbar region without neurogenic claudication: Secondary | ICD-10-CM | POA: Diagnosis not present

## 2024-04-29 DIAGNOSIS — Z0000 Encounter for general adult medical examination without abnormal findings: Secondary | ICD-10-CM | POA: Diagnosis not present

## 2024-04-29 DIAGNOSIS — F339 Major depressive disorder, recurrent, unspecified: Secondary | ICD-10-CM | POA: Diagnosis not present

## 2024-04-29 DIAGNOSIS — K5909 Other constipation: Secondary | ICD-10-CM | POA: Diagnosis not present

## 2024-04-29 DIAGNOSIS — E782 Mixed hyperlipidemia: Secondary | ICD-10-CM | POA: Diagnosis not present

## 2024-05-03 DIAGNOSIS — M6281 Muscle weakness (generalized): Secondary | ICD-10-CM | POA: Diagnosis not present

## 2024-05-03 DIAGNOSIS — M25611 Stiffness of right shoulder, not elsewhere classified: Secondary | ICD-10-CM | POA: Diagnosis not present

## 2024-05-03 DIAGNOSIS — M19011 Primary osteoarthritis, right shoulder: Secondary | ICD-10-CM | POA: Diagnosis not present

## 2024-05-10 DIAGNOSIS — M6281 Muscle weakness (generalized): Secondary | ICD-10-CM | POA: Diagnosis not present

## 2024-05-10 DIAGNOSIS — M19011 Primary osteoarthritis, right shoulder: Secondary | ICD-10-CM | POA: Diagnosis not present

## 2024-05-10 DIAGNOSIS — M25611 Stiffness of right shoulder, not elsewhere classified: Secondary | ICD-10-CM | POA: Diagnosis not present

## 2024-05-13 DIAGNOSIS — M6281 Muscle weakness (generalized): Secondary | ICD-10-CM | POA: Diagnosis not present

## 2024-05-13 DIAGNOSIS — M25611 Stiffness of right shoulder, not elsewhere classified: Secondary | ICD-10-CM | POA: Diagnosis not present

## 2024-05-13 DIAGNOSIS — M19011 Primary osteoarthritis, right shoulder: Secondary | ICD-10-CM | POA: Diagnosis not present

## 2024-05-20 DIAGNOSIS — H524 Presbyopia: Secondary | ICD-10-CM | POA: Diagnosis not present

## 2024-05-20 DIAGNOSIS — H43813 Vitreous degeneration, bilateral: Secondary | ICD-10-CM | POA: Diagnosis not present

## 2024-05-20 DIAGNOSIS — H5712 Ocular pain, left eye: Secondary | ICD-10-CM | POA: Diagnosis not present

## 2024-05-20 DIAGNOSIS — M6281 Muscle weakness (generalized): Secondary | ICD-10-CM | POA: Diagnosis not present

## 2024-05-20 DIAGNOSIS — M25611 Stiffness of right shoulder, not elsewhere classified: Secondary | ICD-10-CM | POA: Diagnosis not present

## 2024-05-20 DIAGNOSIS — M19011 Primary osteoarthritis, right shoulder: Secondary | ICD-10-CM | POA: Diagnosis not present

## 2024-05-20 DIAGNOSIS — H04123 Dry eye syndrome of bilateral lacrimal glands: Secondary | ICD-10-CM | POA: Diagnosis not present

## 2024-05-20 DIAGNOSIS — H353131 Nonexudative age-related macular degeneration, bilateral, early dry stage: Secondary | ICD-10-CM | POA: Diagnosis not present

## 2024-05-24 ENCOUNTER — Other Ambulatory Visit: Payer: Self-pay | Admitting: Internal Medicine

## 2024-05-24 DIAGNOSIS — M25611 Stiffness of right shoulder, not elsewhere classified: Secondary | ICD-10-CM | POA: Diagnosis not present

## 2024-05-24 DIAGNOSIS — Z1231 Encounter for screening mammogram for malignant neoplasm of breast: Secondary | ICD-10-CM

## 2024-05-24 DIAGNOSIS — M6281 Muscle weakness (generalized): Secondary | ICD-10-CM | POA: Diagnosis not present

## 2024-05-24 DIAGNOSIS — M19011 Primary osteoarthritis, right shoulder: Secondary | ICD-10-CM | POA: Diagnosis not present

## 2024-05-27 ENCOUNTER — Ambulatory Visit
Admission: RE | Admit: 2024-05-27 | Discharge: 2024-05-27 | Disposition: A | Discharge status: Home / Self Care | Source: Ambulatory Visit | Attending: Internal Medicine | Admitting: Internal Medicine

## 2024-05-27 DIAGNOSIS — M19011 Primary osteoarthritis, right shoulder: Secondary | ICD-10-CM | POA: Diagnosis not present

## 2024-05-27 DIAGNOSIS — Z1231 Encounter for screening mammogram for malignant neoplasm of breast: Secondary | ICD-10-CM | POA: Diagnosis not present

## 2024-05-27 DIAGNOSIS — M6281 Muscle weakness (generalized): Secondary | ICD-10-CM | POA: Diagnosis not present

## 2024-05-27 DIAGNOSIS — M25611 Stiffness of right shoulder, not elsewhere classified: Secondary | ICD-10-CM | POA: Diagnosis not present

## 2024-05-31 DIAGNOSIS — M19011 Primary osteoarthritis, right shoulder: Secondary | ICD-10-CM | POA: Diagnosis not present

## 2024-05-31 DIAGNOSIS — M25611 Stiffness of right shoulder, not elsewhere classified: Secondary | ICD-10-CM | POA: Diagnosis not present

## 2024-05-31 DIAGNOSIS — M6281 Muscle weakness (generalized): Secondary | ICD-10-CM | POA: Diagnosis not present

## 2024-06-07 DIAGNOSIS — M810 Age-related osteoporosis without current pathological fracture: Secondary | ICD-10-CM | POA: Diagnosis not present

## 2024-06-07 DIAGNOSIS — M19011 Primary osteoarthritis, right shoulder: Secondary | ICD-10-CM | POA: Diagnosis not present

## 2024-06-07 DIAGNOSIS — M6281 Muscle weakness (generalized): Secondary | ICD-10-CM | POA: Diagnosis not present

## 2024-06-07 DIAGNOSIS — M25611 Stiffness of right shoulder, not elsewhere classified: Secondary | ICD-10-CM | POA: Diagnosis not present

## 2024-06-14 DIAGNOSIS — M6281 Muscle weakness (generalized): Secondary | ICD-10-CM | POA: Diagnosis not present

## 2024-06-14 DIAGNOSIS — M19011 Primary osteoarthritis, right shoulder: Secondary | ICD-10-CM | POA: Diagnosis not present

## 2024-06-14 DIAGNOSIS — M25611 Stiffness of right shoulder, not elsewhere classified: Secondary | ICD-10-CM | POA: Diagnosis not present

## 2024-07-06 DIAGNOSIS — K5909 Other constipation: Secondary | ICD-10-CM | POA: Diagnosis not present

## 2024-07-06 DIAGNOSIS — S32000D Wedge compression fracture of unspecified lumbar vertebra, subsequent encounter for fracture with routine healing: Secondary | ICD-10-CM | POA: Diagnosis not present

## 2024-07-06 DIAGNOSIS — M810 Age-related osteoporosis without current pathological fracture: Secondary | ICD-10-CM | POA: Diagnosis not present

## 2024-07-06 DIAGNOSIS — E559 Vitamin D deficiency, unspecified: Secondary | ICD-10-CM | POA: Diagnosis not present

## 2024-07-28 DIAGNOSIS — M19011 Primary osteoarthritis, right shoulder: Secondary | ICD-10-CM | POA: Diagnosis not present

## 2024-08-04 DIAGNOSIS — K5900 Constipation, unspecified: Secondary | ICD-10-CM | POA: Diagnosis not present

## 2024-08-09 DIAGNOSIS — E559 Vitamin D deficiency, unspecified: Secondary | ICD-10-CM | POA: Diagnosis not present

## 2024-08-09 DIAGNOSIS — M810 Age-related osteoporosis without current pathological fracture: Secondary | ICD-10-CM | POA: Diagnosis not present

## 2024-08-09 DIAGNOSIS — M25572 Pain in left ankle and joints of left foot: Secondary | ICD-10-CM | POA: Diagnosis not present
